# Patient Record
Sex: Female | Born: 1945 | Race: White | Hispanic: No | Marital: Married | Smoking: Never smoker
Health system: Southern US, Community
[De-identification: ages and names within clinical notes are randomized; demographics above are authoritative.]

## PROBLEM LIST (undated history)

## (undated) DIAGNOSIS — T7840XA Allergy, unspecified, initial encounter: Secondary | ICD-10-CM

## (undated) DIAGNOSIS — H269 Unspecified cataract: Secondary | ICD-10-CM

## (undated) DIAGNOSIS — E785 Hyperlipidemia, unspecified: Secondary | ICD-10-CM

## (undated) DIAGNOSIS — M8580 Other specified disorders of bone density and structure, unspecified site: Secondary | ICD-10-CM

## (undated) DIAGNOSIS — Z5189 Encounter for other specified aftercare: Secondary | ICD-10-CM

## (undated) DIAGNOSIS — E039 Hypothyroidism, unspecified: Secondary | ICD-10-CM

## (undated) DIAGNOSIS — F419 Anxiety disorder, unspecified: Secondary | ICD-10-CM

## (undated) HISTORY — DX: Hypothyroidism, unspecified: E03.9

## (undated) HISTORY — PX: COLONOSCOPY: SHX174

## (undated) HISTORY — DX: Other specified disorders of bone density and structure, unspecified site: M85.80

## (undated) HISTORY — DX: Unspecified cataract: H26.9

## (undated) HISTORY — DX: Encounter for other specified aftercare: Z51.89

## (undated) HISTORY — DX: Hyperlipidemia, unspecified: E78.5

## (undated) HISTORY — PX: POLYPECTOMY: SHX149

## (undated) HISTORY — DX: Allergy, unspecified, initial encounter: T78.40XA

## (undated) HISTORY — DX: Anxiety disorder, unspecified: F41.9

---

## 1970-04-01 DIAGNOSIS — Z5189 Encounter for other specified aftercare: Secondary | ICD-10-CM

## 1970-04-01 HISTORY — DX: Encounter for other specified aftercare: Z51.89

## 1978-12-01 DIAGNOSIS — E039 Hypothyroidism, unspecified: Secondary | ICD-10-CM

## 1978-12-01 HISTORY — DX: Hypothyroidism, unspecified: E03.9

## 1980-04-01 HISTORY — PX: THYROIDECTOMY, PARTIAL: SHX18

## 1989-04-01 HISTORY — PX: LITHOTRIPSY: SUR834

## 1991-04-02 HISTORY — PX: LUMBAR DISC SURGERY: SHX700

## 1994-04-01 HISTORY — PX: LAPAROSCOPIC HYSTERECTOMY: SHX1926

## 1994-04-01 HISTORY — PX: BREAST BIOPSY: SHX20

## 1997-04-01 HISTORY — PX: CRANIOPLASTY: SHX1407

## 1998-01-30 ENCOUNTER — Inpatient Hospital Stay (HOSPITAL_COMMUNITY): Admission: RE | Admit: 1998-01-30 | Discharge: 1998-01-31 | Payer: Self-pay | Admitting: Neurosurgery

## 1998-01-30 ENCOUNTER — Encounter: Payer: Self-pay | Admitting: Neurosurgery

## 2004-10-23 ENCOUNTER — Ambulatory Visit: Payer: Self-pay | Admitting: Unknown Physician Specialty

## 2005-11-01 ENCOUNTER — Ambulatory Visit: Payer: Self-pay | Admitting: Gastroenterology

## 2005-11-19 ENCOUNTER — Encounter (INDEPENDENT_AMBULATORY_CARE_PROVIDER_SITE_OTHER): Payer: Self-pay | Admitting: Gastroenterology

## 2005-11-19 ENCOUNTER — Ambulatory Visit: Payer: Self-pay | Admitting: Gastroenterology

## 2007-10-30 ENCOUNTER — Encounter: Payer: Self-pay | Admitting: Gastroenterology

## 2008-04-01 HISTORY — PX: LAPAROSCOPIC SIGMOID COLECTOMY: SHX5928

## 2008-05-13 ENCOUNTER — Encounter: Payer: Self-pay | Admitting: Gastroenterology

## 2008-05-26 ENCOUNTER — Ambulatory Visit: Payer: Self-pay | Admitting: Gastroenterology

## 2008-05-26 DIAGNOSIS — K625 Hemorrhage of anus and rectum: Secondary | ICD-10-CM | POA: Insufficient documentation

## 2008-05-26 DIAGNOSIS — K5732 Diverticulitis of large intestine without perforation or abscess without bleeding: Secondary | ICD-10-CM | POA: Insufficient documentation

## 2008-06-03 ENCOUNTER — Ambulatory Visit: Payer: Self-pay | Admitting: Gastroenterology

## 2008-08-23 ENCOUNTER — Telehealth: Payer: Self-pay | Admitting: Gastroenterology

## 2008-08-24 ENCOUNTER — Ambulatory Visit: Payer: Self-pay | Admitting: Gastroenterology

## 2008-09-08 ENCOUNTER — Encounter: Payer: Self-pay | Admitting: Gastroenterology

## 2008-09-28 ENCOUNTER — Other Ambulatory Visit: Admission: RE | Admit: 2008-09-28 | Discharge: 2008-09-28 | Payer: Self-pay | Admitting: Diagnostic Radiology

## 2008-09-28 ENCOUNTER — Encounter: Admission: RE | Admit: 2008-09-28 | Discharge: 2008-09-28 | Payer: Self-pay | Admitting: Internal Medicine

## 2009-02-02 ENCOUNTER — Encounter: Payer: Self-pay | Admitting: Gastroenterology

## 2009-02-07 ENCOUNTER — Ambulatory Visit: Payer: Self-pay | Admitting: Surgery

## 2009-02-07 ENCOUNTER — Telehealth: Payer: Self-pay | Admitting: Gastroenterology

## 2009-02-14 ENCOUNTER — Inpatient Hospital Stay: Payer: Self-pay | Admitting: Surgery

## 2009-03-01 ENCOUNTER — Encounter: Payer: Self-pay | Admitting: Gastroenterology

## 2009-10-17 ENCOUNTER — Encounter: Admission: RE | Admit: 2009-10-17 | Discharge: 2009-10-17 | Payer: Self-pay | Admitting: Internal Medicine

## 2010-01-17 ENCOUNTER — Encounter: Admission: RE | Admit: 2010-01-17 | Discharge: 2010-01-17 | Payer: Self-pay | Admitting: Internal Medicine

## 2010-06-20 ENCOUNTER — Other Ambulatory Visit: Payer: Self-pay | Admitting: Internal Medicine

## 2010-06-20 DIAGNOSIS — E049 Nontoxic goiter, unspecified: Secondary | ICD-10-CM

## 2010-07-20 ENCOUNTER — Other Ambulatory Visit: Payer: Self-pay

## 2010-07-25 ENCOUNTER — Ambulatory Visit
Admission: RE | Admit: 2010-07-25 | Discharge: 2010-07-25 | Disposition: A | Discharge status: Home / Self Care | Payer: Medicare Other | Source: Ambulatory Visit | Attending: Internal Medicine | Admitting: Internal Medicine

## 2010-07-25 DIAGNOSIS — E049 Nontoxic goiter, unspecified: Secondary | ICD-10-CM

## 2011-10-23 ENCOUNTER — Other Ambulatory Visit: Payer: Self-pay | Admitting: Internal Medicine

## 2011-10-23 DIAGNOSIS — E042 Nontoxic multinodular goiter: Secondary | ICD-10-CM

## 2011-10-24 ENCOUNTER — Ambulatory Visit
Admission: RE | Admit: 2011-10-24 | Discharge: 2011-10-24 | Disposition: A | Discharge status: Home / Self Care | Payer: Medicare Other | Source: Ambulatory Visit | Attending: Internal Medicine | Admitting: Internal Medicine

## 2011-10-24 DIAGNOSIS — E042 Nontoxic multinodular goiter: Secondary | ICD-10-CM

## 2013-04-30 ENCOUNTER — Encounter: Payer: Self-pay | Admitting: Gastroenterology

## 2013-05-04 ENCOUNTER — Other Ambulatory Visit: Payer: Self-pay | Admitting: Dermatology

## 2013-05-06 ENCOUNTER — Encounter: Payer: Self-pay | Admitting: Gastroenterology

## 2013-06-25 ENCOUNTER — Ambulatory Visit (AMBULATORY_SURGERY_CENTER): Payer: Medicare Other | Admitting: *Deleted

## 2013-06-25 VITALS — Ht 64.0 in | Wt 154.0 lb

## 2013-06-25 DIAGNOSIS — Z800 Family history of malignant neoplasm of digestive organs: Secondary | ICD-10-CM

## 2013-06-25 MED ORDER — NA SULFATE-K SULFATE-MG SULF 17.5-3.13-1.6 GM/177ML PO SOLN
1.0000 | Freq: Once | ORAL | Status: DC
Start: 2013-06-25 — End: 2013-07-15

## 2013-06-25 NOTE — Progress Notes (Signed)
No allergies to eggs or soy. No problems with anesthesia.  Pt given Emmi instructions for colonoscopy  

## 2013-07-01 ENCOUNTER — Encounter: Payer: Self-pay | Admitting: Gastroenterology

## 2013-07-08 ENCOUNTER — Ambulatory Visit (AMBULATORY_SURGERY_CENTER): Payer: Medicare Other | Admitting: Gastroenterology

## 2013-07-08 ENCOUNTER — Encounter: Payer: Self-pay | Admitting: Gastroenterology

## 2013-07-08 VITALS — BP 122/60 | HR 65 | Temp 97.3°F | Resp 17 | Ht 64.0 in | Wt 154.0 lb

## 2013-07-08 DIAGNOSIS — D126 Benign neoplasm of colon, unspecified: Secondary | ICD-10-CM

## 2013-07-08 DIAGNOSIS — Z1211 Encounter for screening for malignant neoplasm of colon: Secondary | ICD-10-CM

## 2013-07-08 DIAGNOSIS — Z800 Family history of malignant neoplasm of digestive organs: Secondary | ICD-10-CM

## 2013-07-08 DIAGNOSIS — K5730 Diverticulosis of large intestine without perforation or abscess without bleeding: Secondary | ICD-10-CM

## 2013-07-08 MED ORDER — SODIUM CHLORIDE 0.9 % IV SOLN: 500.0000 mL | INTRAVENOUS | Status: DC

## 2013-07-08 NOTE — Patient Instructions (Signed)
YOU HAD AN ENDOSCOPIC PROCEDURE TODAY AT THE [facility]: Refer to the procedure report that was given to you for any specific questions about what was found during the examination.  If the procedure report does not answer your questions, please call your gastroenterologist to clarify.  If you requested that your care partner not be given the details of your procedure findings, then the procedure report has been included in a sealed envelope for you to review at your convenience later.  YOU SHOULD EXPECT: Some feelings of bloating in the abdomen. Passage of more gas than usual.  Walking can help get rid of the air that was put into your GI tract during the procedure and reduce the bloating. If you had a lower endoscopy (such as a colonoscopy or flexible sigmoidoscopy) you may notice spotting of blood in your stool or on the toilet paper. If you underwent a bowel prep for your procedure, then you may not have a normal bowel movement for a few days.  DIET: Your first meal following the procedure should be a light meal and then it is ok to progress to your normal diet.  A half-sandwich or bowl of soup is an example of a good first meal.  Heavy or fried foods are harder to digest and may make you feel nauseous or bloated.  Likewise meals heavy in dairy and vegetables can cause extra gas to form and this can also increase the bloating.  Drink plenty of fluids but you should avoid alcoholic beverages for 24 hours.  ACTIVITY: Your care partner should take you home directly after the procedure.  You should plan to take it easy, moving slowly for the rest of the day.  You can resume normal activity the day after the procedure however you should NOT DRIVE or use heavy machinery for 24 hours (because of the sedation medicines used during the test).    SYMPTOMS TO REPORT IMMEDIATELY: A gastroenterologist can be reached at any hour.  During normal business hours, 8:30 AM to 5:00 PM Monday through Friday,  call [phone].  After hours and on weekends, please call the GI answering service at [phone] who will take a message and have the physician on call contact you.   Following lower endoscopy (colonoscopy or flexible sigmoidoscopy):  Excessive amounts of blood in the stool  Significant tenderness or worsening of abdominal pains  Swelling of the abdomen that is new, acute  Fever of 100F or higher   FOLLOW UP: If any biopsies were taken you will be contacted by phone or by letter within the next 1-3 weeks.  Call your gastroenterologist if you have not heard about the biopsies in 3 weeks.  Our staff will call the home number listed on your records the next business day following your procedure to check on you and address any questions or concerns that you may have at that time regarding the information given to you following your procedure. This is a courtesy call and so if there is no answer at the home number and we have not heard from you through the emergency physician on call, we will assume that you have returned to your regular daily activities without incident.  SIGNATURES/CONFIDENTIALITY: You and/or your care partner have signed paperwork which will be entered into your electronic medical record.  These signatures attest to the fact that that the information above on your After Visit Summary has been reviewed and is understood.  Full responsibility of the confidentiality of   this discharge information lies with you and/or your care-partner.   Resume medications. Information given on polyps,diverticulosis and high fiber diet with discharge instructions. 

## 2013-07-08 NOTE — Progress Notes (Signed)
A/ox3 pleased with MAC, report to Sheila RN 

## 2013-07-08 NOTE — Op Note (Signed)
[facility]  [address] [place], [id number]   COLONOSCOPY PROCEDURE REPORT  PATIENT: Alexis Hull, Alexis Hull  MR#: [id number] BIRTHDATE: 1945/10/08 , 68  yrs. old GENDER: Female ENDOSCOPIST: Inda Castle, MD REFERRED [id number] Dione Housekeeper, M.D. PROCEDURE DATE:  07/08/2013 PROCEDURE:   Colonoscopy with snare polypectomy First Screening Colonoscopy - Avg.  risk and is 50 yrs.  old or older - No.  Prior Negative Screening - Now for repeat screening. Above average risk  History of Adenoma - Now for follow-up colonoscopy & has been > or = to 3 yrs.  N/A  Polyps Removed Today? Yes. ASA CLASS:   Class II INDICATIONS:Patient's immediate family history of colon cancer. (mother, early 3's, maternal grandmother - age unknown) MEDICATIONS: MAC sedation, administered by CRNA and propofol (Diprivan) 200mg  IV  DESCRIPTION OF PROCEDURE:   After the risks benefits and alternatives of the procedure were thoroughly explained, informed consent was obtained.  A digital rectal exam revealed no abnormalities of the rectum.   The [id number]  endoscope was introduced through the anus and advanced to the cecum, which was identified by both the appendix and ileocecal valve. No adverse events experienced.   The quality of the prep was excellent using Suprep  The instrument was then slowly withdrawn as the colon was fully examined.      COLON FINDINGS: Surgical anastomosis was noted at 25 cm from the anus (status post sigmoid resection).   A sessile polyp measuring 8 mm in size was found in the ascending colon.  A polypectomy was performed with a cold snare.  The resection was complete and the polyp tissue was completely retrieved.   There was mild scattered diverticulosis noted in the sigmoid colon, descending colon, transverse colon, and ascending colon.   The colon was otherwise normal.  There was no diverticulosis, inflammation, polyps or cancers unless previously stated.   Retroflexed views revealed no abnormalities. The time to cecum=3 minutes 36 seconds.  Withdrawal time=8 minutes 59 seconds.  The scope was withdrawn and the procedure completed. COMPLICATIONS: There were no complications.  ENDOSCOPIC IMPRESSION: 1.   Surgical anastomosis was noted at 25 cm from the anus (status post sigmoid resection). 2.   Sessile polyp measuring 8 mm in size was found in the ascending colon; polypectomy was performed with a cold snare 3.   There was mild diverticulosis noted in the sigmoid colon, descending colon, transverse colon, and ascending colon 4.   The colon was otherwise normal  RECOMMENDATIONS: Given your significant family history of colon cancer, you should have a repeat colonoscopy in 5 years   eSigned:  Inda Castle, MD 07/08/2013 10:07 AM   cc:   PATIENT NAME:  Alexis Hull, Alexis Hull MR#: [id number]

## 2013-07-08 NOTE — Progress Notes (Signed)
Called to room to assist during endoscopic procedure.  Patient ID and intended procedure confirmed with present staff. Received instructions for my participation in the procedure from the performing physician.  

## 2013-07-09 ENCOUNTER — Telehealth: Payer: Self-pay | Admitting: *Deleted

## 2013-07-09 NOTE — Telephone Encounter (Signed)
  Follow up Call-  Call back number 07/08/2013  Post procedure Call Back phone  # [phone]  Permission to leave phone message Yes     Patient questions:  Do you have a fever, pain , or abdominal swelling? no Pain Score  0 *  Have you tolerated food without any problems? yes  Have you been able to return to your normal activities? yes  Do you have any questions about your discharge instructions: Diet   no Medications  no Follow up visit  no  Do you have questions or concerns about your Care? no  Actions: * If pain score is 4 or above: No action needed, pain <4.

## 2013-07-13 ENCOUNTER — Encounter: Payer: Self-pay | Admitting: Gastroenterology

## 2013-07-14 ENCOUNTER — Encounter: Payer: Self-pay | Admitting: Nurse Practitioner

## 2013-07-15 ENCOUNTER — Ambulatory Visit: Payer: Self-pay | Admitting: Nurse Practitioner

## 2013-07-15 ENCOUNTER — Encounter: Payer: Self-pay | Admitting: Nurse Practitioner

## 2013-07-15 ENCOUNTER — Ambulatory Visit (INDEPENDENT_AMBULATORY_CARE_PROVIDER_SITE_OTHER): Payer: Medicare Other | Admitting: Nurse Practitioner

## 2013-07-15 VITALS — BP 138/86 | HR 72 | Resp 18 | Ht 64.0 in | Wt 152.0 lb

## 2013-07-15 DIAGNOSIS — E039 Hypothyroidism, unspecified: Secondary | ICD-10-CM

## 2013-07-15 DIAGNOSIS — Z0000 Encounter for general adult medical examination without abnormal findings: Secondary | ICD-10-CM

## 2013-07-15 DIAGNOSIS — M899 Disorder of bone, unspecified: Secondary | ICD-10-CM

## 2013-07-15 DIAGNOSIS — M949 Disorder of cartilage, unspecified: Secondary | ICD-10-CM

## 2013-07-15 DIAGNOSIS — N952 Postmenopausal atrophic vaginitis: Secondary | ICD-10-CM

## 2013-07-15 DIAGNOSIS — M8580 Other specified disorders of bone density and structure, unspecified site: Secondary | ICD-10-CM

## 2013-07-15 DIAGNOSIS — Z01419 Encounter for gynecological examination (general) (routine) without abnormal findings: Secondary | ICD-10-CM

## 2013-07-15 DIAGNOSIS — E042 Nontoxic multinodular goiter: Secondary | ICD-10-CM

## 2013-07-15 MED ORDER — ESTROGENS, CONJUGATED 0.625 MG/GM VA CREA
TOPICAL_CREAM | VAGINAL | Status: DC
Start: 2013-07-15 — End: 2015-03-20

## 2013-07-15 NOTE — Patient Instructions (Signed)

## 2013-07-15 NOTE — Progress Notes (Signed)
68 y.o. G46P0020 Married Caucasian Fe here for annual exam.  This past January  passed a kidney stone.  She feels well now.  She continues using her Premarin vaginal cream which really helps her with vaginal dryness and dyspareunia.  She also has had less urinary tract infections. She finds that her cream runs out before her next RX can be filled and that is frustrating for her as she has to ration her med's.  She leaves next week for a trip to [place] and then cruise on the [place].  Patient's last menstrual period was 08/31/1994.          Sexually active: yes  The current method of family planning is none.    Exercising: yes  [employer]  Smoker:  no  Health Maintenance: Pap:  2010 MMG:  10/2012 - Normal - Per patient Colonoscopy:  07/08/2013 1 polyp results are pending every 5 years  BMD:   11/2012 osteopenia improved TDaP:  09/2011 Shingles: age 33 Labs: PCP   reports that she has never smoked. She has never used smokeless tobacco. She reports that she drinks about 7.2 ounces of alcohol per week. She reports that she does not use illicit drugs.  Past Medical History  Diagnosis Date  . Blood transfusion without reported diagnosis 1972    after miscarriage  . Hypothyroidism 1980's    Past Surgical History  Procedure Laterality Date  . Thyroidectomy, partial Left 1982  . Lumbar disc surgery  1993    discectomy  . Breast biopsy Right 1996    benign  . Laparoscopic hysterectomy  1996  . Cranioplasty  1999    Lytic skull lesion  . Laparoscopic sigmoid colectomy  2010    recurrent divertic  . Lithotripsy  1991    with sling and stent    Current Outpatient Prescriptions  Medication Sig Dispense Refill  . ALPRAZolam (XANAX) 0.25 MG tablet Take 0.25 mg by mouth at bedtime as needed for anxiety.      Marland Kitchen aspirin 81 MG tablet Take 81 mg by mouth once a week.       . Calcium Carb-Cholecalciferol (CALCIUM 600 + D PO) Take by mouth daily.      . Cholecalciferol (D3 ADULT PO)  Take by mouth daily.      Marland Kitchen CRANBERRY PO Take by mouth. Takes 4200 mg daily      . [facility]S MAGNESIUM PO Take by mouth daily.      . Estrogens, Conjugated (PREMARIN VA) Place vaginally 2 (two) times a week.      . Glucosamine-Chondroit-Vit C-Mn (GLUCOSAMINE CHONDR 1500 COMPLX PO) Take by mouth daily.      Marland Kitchen KRILL OIL PO Take 300 mg by mouth daily.      Marland Kitchen levothyroxine (SYNTHROID, LEVOTHROID) 50 MCG tablet Take 50 mcg by mouth daily before breakfast.      . loratadine (CLARITIN) 10 MG tablet Take 10 mg by mouth daily.      . Methylcellulose, Laxative, (CITRUCEL) 500 MG TABS Take by mouth daily.      . Multiple Vitamin (MULTIVITAMIN) tablet Take 1 tablet by mouth daily.      Marland Kitchen tretinoin (RETIN-A) 0.025 % cream Apply topically at bedtime.       . conjugated estrogens (PREMARIN) vaginal cream Use 1 g vaginally three times a week  30 g  12   No current facility-administered medications for this visit.    Family History  Problem Relation Age of Onset  . Colon cancer  Mother 71  . Stroke Mother   . Aneurysm Mother     brain  . Breast cancer Mother   . Colon cancer Maternal Grandmother     not sure age of onset  . Diabetes Father   . Heart attack Father   . Hypertension Brother     ROS:  Pertinent items are noted in HPI.  Otherwise, a comprehensive ROS was negative.  Exam:   BP 138/86  Pulse 72  Resp 18  Ht 5\' 4"  (1.626 m)  Wt 152 lb (68.947 kg)  BMI 26.08 kg/m2  LMP 08/31/1994 Height: 5\' 4"  (162.6 cm)  Ht Readings from Last 3 Encounters:  07/15/13 5\' 4"  (1.626 m)  07/08/13 5\' 4"  (1.626 m)  06/25/13 5\' 4"  (1.626 m)    General appearance: alert, cooperative and appears stated age Head: Normocephalic, without obvious abnormality, atraumatic Neck: no adenopathy, supple, symmetrical, trachea midline and thyroid nodule on the right tto inspection and palpation Lungs: clear to auscultation bilaterally Breasts: normal appearance, no masses or tenderness Heart: regular rate and  rhythm Abdomen: soft, non-tender; no masses,  no organomegaly Extremities: extremities normal, atraumatic, no cyanosis or edema Skin: Skin color, texture, turgor normal. No rashes or lesions Lymph nodes: Cervical, supraclavicular, and axillary nodes normal. No abnormal inguinal nodes palpated Neurologic: Grossly normal   Pelvic: External genitalia:  no lesions              Urethra:  normal appearing urethra with no masses, tenderness or lesions              Bartholin's and Skene's: normal                 Vagina: atrophic appearing vagina with pale color and discharge, no lesions              Cervix: absent              Pap taken: yes per request Bimanual Exam:  Uterus:  uterus absent              Adnexa: no mass, fullness, tenderness               Rectovaginal: Confirms               Anus:  normal sphincter tone, no lesions  A:  Well Woman with normal exam  Postmenopausal  Atrophic vaginitis some improved on vaginal estrogen  Osteopenia - stable  Hypothyroid and history of thyroid nodules that are stable  P:   Pap smear as per guidelines   Mammogram due 10/2013  Refill on Premarin Vaginal cream for a year  Counseled with  Potential risk of estrogen with DVT, CVA, cancer.  She is aware on the trip to keep extremities moving while flying and do ankle pumps.  Counseled on breast self exam, mammography screening, use and side effects of HRT, adequate intake of calcium and vitamin D, diet and exercise, Kegel's exercises return annually or prn ROI for Mammo results at [facility] An After Visit Summary was printed and given to the patient.

## 2013-07-19 NOTE — Progress Notes (Signed)
Encounter reviewed by Dr. Brook Silva.  

## 2013-07-20 LAB — IPS PAP SMEAR ONLY

## 2013-09-01 ENCOUNTER — Other Ambulatory Visit: Payer: Self-pay | Admitting: Dermatology

## 2014-01-31 ENCOUNTER — Encounter: Payer: Self-pay | Admitting: Nurse Practitioner

## 2014-04-01 DIAGNOSIS — E785 Hyperlipidemia, unspecified: Secondary | ICD-10-CM

## 2014-04-01 HISTORY — DX: Hyperlipidemia, unspecified: E78.5

## 2014-10-06 ENCOUNTER — Other Ambulatory Visit: Payer: Self-pay | Admitting: Internal Medicine

## 2014-10-06 DIAGNOSIS — E042 Nontoxic multinodular goiter: Secondary | ICD-10-CM

## 2014-10-07 ENCOUNTER — Ambulatory Visit
Admission: RE | Admit: 2014-10-07 | Discharge: 2014-10-07 | Disposition: A | Discharge status: Home / Self Care | Payer: Medicare Other | Source: Ambulatory Visit | Attending: Internal Medicine | Admitting: Internal Medicine

## 2014-10-07 DIAGNOSIS — E042 Nontoxic multinodular goiter: Secondary | ICD-10-CM

## 2014-11-01 ENCOUNTER — Encounter: Payer: Self-pay | Admitting: Gastroenterology

## 2015-03-20 ENCOUNTER — Ambulatory Visit (INDEPENDENT_AMBULATORY_CARE_PROVIDER_SITE_OTHER): Payer: Medicare Other | Admitting: Nurse Practitioner

## 2015-03-20 ENCOUNTER — Encounter: Payer: Self-pay | Admitting: Nurse Practitioner

## 2015-03-20 VITALS — BP 144/90 | HR 76 | Ht 64.0 in | Wt 149.0 lb

## 2015-03-20 DIAGNOSIS — Z01419 Encounter for gynecological examination (general) (routine) without abnormal findings: Secondary | ICD-10-CM

## 2015-03-20 DIAGNOSIS — N952 Postmenopausal atrophic vaginitis: Secondary | ICD-10-CM | POA: Diagnosis not present

## 2015-03-20 DIAGNOSIS — M8580 Other specified disorders of bone density and structure, unspecified site: Secondary | ICD-10-CM | POA: Diagnosis not present

## 2015-03-20 MED ORDER — ESTROGENS, CONJUGATED 0.625 MG/GM VA CREA: TOPICAL_CREAM | VAGINAL | Status: DC

## 2015-03-20 NOTE — Patient Instructions (Signed)

## 2015-03-20 NOTE — Progress Notes (Signed)
Patient ID: Alexis Hull, female   DOB: 05/09/1945, 69 y.o.   MRN: [id number]  69 y.o. G2P0020 Married  Caucasian Fe here for annual exam.  No new health problems.  Patient's last menstrual period was 08/31/1994 (approximate).          Sexually active: Yes.    The current method of family planning is status post hysterectomy.    Exercising: No.  The patient does not participate in regular exercise at present. Smoker:  no  Health Maintenance: Pap: 07/15/13, Negative MMG: 09/2014, normal per patient, [facility] Colonoscopy: 07/08/13, polyp-adenoma, repeat in 5 years BMD: 12/15/12, Dr. Brigitte Pulse TDaP:  10/19/10 Zostavax: 04/01/05 Pneumovax: 09/2011, Prevnar 10/06/14 Hep C and HIV:  Will discuss with Dr. Brigitte Pulse Labs: PCP, Dr Brigitte Pulse takes care of all labs   reports that she has never smoked. She has never used smokeless tobacco. She reports that she drinks about 7.2 oz of alcohol per week. She reports that she does not use illicit drugs.  Past Medical History  Diagnosis Date  . Blood transfusion without reported diagnosis 1972    after miscarriage  . Hypothyroidism 1980's  . Hyperlipidemia 2016    atorvastatin    Past Surgical History  Procedure Laterality Date  . Thyroidectomy, partial Left 1982  . Lumbar disc surgery  1993    discectomy  . Breast biopsy Right 1996    benign  . Laparoscopic hysterectomy  1996  . Cranioplasty  1999    Lytic skull lesion  . Laparoscopic sigmoid colectomy  2010    recurrent divertic  . Lithotripsy  1991    with sling and stent    Current Outpatient Prescriptions  Medication Sig Dispense Refill  . ALPRAZolam (XANAX) 0.25 MG tablet Take 0.25 mg by mouth at bedtime as needed for anxiety.    Marland Kitchen aspirin 81 MG tablet Take 81 mg by mouth once a week.     Marland Kitchen atorvastatin (LIPITOR) 10 MG tablet Take 10 mg by mouth at bedtime.  6  . Biotin 5000 MCG TABS Take 1 tablet by mouth daily.    . Calcium Carb-Cholecalciferol (CALCIUM 600 + D PO) Take by mouth daily.    Marland Kitchen  conjugated estrogens (PREMARIN) vaginal cream Use 1 g vaginally three times a week 30 g 12  . CRANBERRY PO Take by mouth. Takes 4200 mg daily    . levothyroxine (SYNTHROID, LEVOTHROID) 50 MCG tablet Take 50 mcg by mouth every evening.     . loratadine (CLARITIN) 10 MG tablet Take 10 mg by mouth daily.    . Methylcellulose, Laxative, (CITRUCEL) 500 MG TABS Take by mouth daily. Citrucel    . Multiple Vitamins-Minerals (HAIR/SKIN/NAILS PO) Take 1 tablet by mouth daily.    . NON FORMULARY ALGAE 900 DHA 300 MG, 1 capsule daily    . tretinoin (RETIN-A) 0.025 % cream Apply topically at bedtime.      No current facility-administered medications for this visit.    Family History  Problem Relation Age of Onset  . Colon cancer Mother 42  . Stroke Mother   . Aneurysm Mother     brain  . Breast cancer Mother   . Colon cancer Maternal Grandmother     not sure age of onset  . Diabetes Father   . Heart attack Father   . Hypertension Brother     ROS:  Pertinent items are noted in HPI.  Otherwise, a comprehensive ROS was negative.  Exam:   BP 144/90 mmHg  Pulse  76  Ht 5\' 4"  (1.626 m)  Wt 149 lb (67.586 kg)  BMI 25.56 kg/m2  LMP 08/31/1994 (Approximate) Height: 5\' 4"  (162.6 cm) Ht Readings from Last 3 Encounters:  03/20/15 5\' 4"  (1.626 m)  07/15/13 5\' 4"  (1.626 m)  07/08/13 5\' 4"  (1.626 m)    General appearance: alert, cooperative and appears stated age Head: Normocephalic, without obvious abnormality, atraumatic Neck: no adenopathy, supple, symmetrical, trachea midline and thyroid normal to inspection and palpation Lungs: clear to auscultation bilaterally Breasts: normal appearance, no masses or tenderness Heart: regular rate and rhythm Abdomen: soft, non-tender; no masses,  no organomegaly Extremities: extremities normal, atraumatic, no cyanosis or edema Skin: Skin color, texture, turgor normal. No rashes or lesions Lymph nodes: Cervical, supraclavicular, and axillary nodes  normal. No abnormal inguinal nodes palpated Neurologic: Grossly normal   Pelvic: External genitalia:  no lesions              Urethra:  normal appearing urethra with no masses, tenderness or lesions              Bartholin's and Skene's: normal                 Vagina: normal appearing vagina with normal color and discharge, no lesions              Cervix: anteverted              Pap taken: No. Bimanual Exam:  Uterus:  normal size, contour, position, consistency, mobility, non-tender              Adnexa: no mass, fullness, tenderness               Rectovaginal: Confirms               Anus:  normal sphincter tone, no lesions  Chaperone present: no   A:  Well Woman with normal exam  Postmenopausal Atrophic vaginitis some improved on vaginal estrogen Osteopenia - stable Hypothyroid and history of thyroid nodules that are stable   P:   Reviewed health and wellness pertinent to exam  Pap smear as above  Mammogram is due 09/2015  Counseled on breast self exam, mammography screening, adequate intake of calcium and vitamin D, diet and exercise return annually or prn  An After Visit Summary was printed and given to the patient.

## 2015-03-21 NOTE — Progress Notes (Signed)
Encounter reviewed Trinisha Paget, MD   

## 2017-07-14 ENCOUNTER — Telehealth: Payer: Self-pay | Admitting: Obstetrics and Gynecology

## 2017-07-14 NOTE — Telephone Encounter (Signed)
Patient is having an issue with vaginal dryness. Last seen 03/20/15.

## 2017-07-14 NOTE — Telephone Encounter (Signed)
Call placed to [facility], spoke with Tyranny, requested copy of MMG dated 11/19/16 be faxed to office at [phone].

## 2017-07-14 NOTE — Telephone Encounter (Signed)
Copy of MMG report received, placed on Melvia Heaps, CNM desk for review.   Routing to provider for final review. Patient is agreeable to disposition. Will close encounter.

## 2017-07-14 NOTE — Telephone Encounter (Signed)
Spoke with patient. Patient reports vaginal dryness, has used premarin vaginal cream in the past, worked well. Patient requesting OV to discuss tx options. Last AEX 03/20/15 with Kem Boroughs, NP.   Denies any other GYN symptoms or bleeding.  Declines to schedule AEX, states prefers to continue AEX with PCP. Last MMG 11/19/2016 at [facility]. Advised patient will call [facility] to request copy of recent MMG. OV scheduled for 07/15/17 at 1:45pm with Melvia Heaps, CNM. Patient verbalizes understanding.

## 2017-07-15 ENCOUNTER — Encounter: Payer: Self-pay | Admitting: Certified Nurse Midwife

## 2017-07-15 ENCOUNTER — Ambulatory Visit: Payer: Medicare Other | Admitting: Certified Nurse Midwife

## 2017-07-15 ENCOUNTER — Other Ambulatory Visit: Payer: Self-pay

## 2017-07-15 VITALS — BP 120/80 | HR 64 | Resp 16 | Ht 64.0 in | Wt 146.0 lb

## 2017-07-15 DIAGNOSIS — Z0000 Encounter for general adult medical examination without abnormal findings: Secondary | ICD-10-CM

## 2017-07-15 DIAGNOSIS — Z01419 Encounter for gynecological examination (general) (routine) without abnormal findings: Secondary | ICD-10-CM

## 2017-07-15 DIAGNOSIS — N952 Postmenopausal atrophic vaginitis: Secondary | ICD-10-CM

## 2017-07-15 MED ORDER — ESTROGENS, CONJUGATED 0.625 MG/GM VA CREA: TOPICAL_CREAM | VAGINAL | 6 refills | Status: AC

## 2017-07-15 NOTE — Progress Notes (Signed)
72 y.o. Married Caucasian female G2P0020 here with complaint of vaginal dryness with discomfort with sexual activity if she does not use Premarin cream. No symptoms of itching, burning, and increase discharge or vaginal infection.Marland Kitchen Describes discharge as normal. Denies vaginal bleeding. History of TAH with BSO for ovarian cysts. Currently using Premarin cream twice weekly, occasionally 3 times weekly. Patient sees PCP for aex and declines gyn aex today. Would like to continue her Premarin cream unless something that would work better. She has been happy with use with no problems. Some dryness at other times occasionally. PCP does not do breast and pelvic exam, but has if asked. She would prefer to do here. No other health issues today.   ROS pertinent to HPI  O:Healthy female WDWN Affect: normal, orientation x 3  Exam: Heart: NSRR, Lungs clear Thyroid not  Enlarged Breasts bilateral: no masses, tenderness, nipple discharge or skin change noted, no axillary enlargement or enlarged or tender lymph nodes Abdomen: soft, non tender, no masses  Inguinal Lymph nodes: no enlargement or tenderness Pelvic exam: External genital: normal female, atrophic appearance BUS: negative Vagina: scant normal appearing discharge, atrophic appearance   Cervix: absent Uterus:absent Adnexa:surgically absent, no masses or fullness noted   A:Normal breast exam Normal pelvic exam Atrophic vaginitis   P:Discussed findings of normal exam. Discussed atrophic vaginitis and etiology. Discussed continued use of Premarin cream for comfort and tissue support. Risks/benefits/warning signs given and need to advise if occurs. Discussed need for exam to be able to continue to give Premarin cream unless PCP is doing exam and will order for her. Plans to come back here to continue. Discussed coconut oil use for vaginal dryness. Instructions given for use with sexual activity. Questions addressed. Rx Premarin cream see order with  instructions  Rv prn, annual

## 2017-08-04 ENCOUNTER — Encounter: Payer: Self-pay | Admitting: Certified Nurse Midwife

## 2018-09-23 ENCOUNTER — Encounter: Payer: Self-pay | Admitting: Gastroenterology

## 2018-09-29 ENCOUNTER — Ambulatory Visit: Payer: Medicare Other | Admitting: *Deleted

## 2018-09-29 ENCOUNTER — Other Ambulatory Visit: Payer: Self-pay

## 2018-09-29 VITALS — Ht 64.0 in | Wt 151.0 lb

## 2018-09-29 DIAGNOSIS — Z86010 Personal history of colonic polyps: Secondary | ICD-10-CM

## 2018-09-29 DIAGNOSIS — Z800 Family history of malignant neoplasm of digestive organs: Secondary | ICD-10-CM

## 2018-09-29 MED ORDER — SUPREP BOWEL PREP KIT 17.5-3.13-1.6 GM/177ML PO SOLN: 1.0000 | Freq: Once | ORAL | 0 refills | Status: AC

## 2018-09-29 NOTE — Progress Notes (Signed)
No egg or soy allergy known to patient  No issues with past sedation with any surgeries  or procedures, no intubation problems  No diet pills per patient No home 02 use per patient  No blood thinners per patient  Pt denies issues with constipation  No A fib or A flutter  EMMI video sent to pt's e mail   Pt verified name, DOB, address and insurance during PV today. Pt mailed instruction packet to included paper to complete and mail back to [facility] with addressed and stamped envelope, Emmi video, copy of consent form to read and not return, and instructions.PV completed over the phone. Pt encouraged to call with questions or issues -[place] in [place] and she asked me to e mail her the instruction packet for her prep- E mail sent and also printed and mailed in her packet   Pt is aware that care partner will wait in the car during parking lot; if they feel like they will be too hot to wait in the car; they may wait in the lobby.  We want them to wear a mask (we do not have any that we can provide them), practice social distancing, and we will check their temperatures when they get here.  I did remind patient that their care partner needs to stay in the parking lot the entire time. Pt will wear mask into building

## 2018-10-01 ENCOUNTER — Telehealth: Payer: Self-pay | Admitting: Gastroenterology

## 2018-10-01 NOTE — Telephone Encounter (Signed)
PV was just done 09-29-2018- pt informed it maybe the weekend before she recieves  I did e mail the instructions 09-29-18 but pt states she did not receive them yet- I re e mailed the instructions to her e mail and her husbands e mail  - pt will call me back and let me know if she has received them- she is att her home in [place] [facility] [facility] and her procedure is next week

## 2018-10-06 ENCOUNTER — Telehealth: Payer: Self-pay | Admitting: Gastroenterology

## 2018-10-06 NOTE — Telephone Encounter (Signed)

## 2018-10-07 ENCOUNTER — Ambulatory Visit (AMBULATORY_SURGERY_CENTER): Payer: Medicare Other | Admitting: Gastroenterology

## 2018-10-07 ENCOUNTER — Encounter: Payer: Self-pay | Admitting: Gastroenterology

## 2018-10-07 ENCOUNTER — Other Ambulatory Visit: Payer: Self-pay

## 2018-10-07 VITALS — BP 122/64 | HR 56 | Temp 98.4°F | Resp 16 | Ht 64.0 in | Wt 151.0 lb

## 2018-10-07 DIAGNOSIS — D120 Benign neoplasm of cecum: Secondary | ICD-10-CM

## 2018-10-07 DIAGNOSIS — Z85038 Personal history of other malignant neoplasm of large intestine: Secondary | ICD-10-CM | POA: Diagnosis not present

## 2018-10-07 DIAGNOSIS — K635 Polyp of colon: Secondary | ICD-10-CM

## 2018-10-07 DIAGNOSIS — Z86010 Personal history of colonic polyps: Secondary | ICD-10-CM

## 2018-10-07 DIAGNOSIS — Z1211 Encounter for screening for malignant neoplasm of colon: Secondary | ICD-10-CM | POA: Diagnosis not present

## 2018-10-07 DIAGNOSIS — D123 Benign neoplasm of transverse colon: Secondary | ICD-10-CM

## 2018-10-07 MED ORDER — SODIUM CHLORIDE 0.9 % IV SOLN: 500.0000 mL | Freq: Once | INTRAVENOUS | Status: DC

## 2018-10-07 NOTE — Patient Instructions (Signed)
YOU HAD AN ENDOSCOPIC PROCEDURE TODAY AT [facility]:   Refer to the procedure report that was given to you for any specific questions about what was found during the examination.  If the procedure report does not answer your questions, please call your gastroenterologist to clarify.  If you requested that your care partner not be given the details of your procedure findings, then the procedure report has been included in a sealed envelope for you to review at your convenience later.  YOU SHOULD EXPECT: Some feelings of bloating in the abdomen. Passage of more gas than usual.  Walking can help get rid of the air that was put into your GI tract during the procedure and reduce the bloating. If you had a lower endoscopy (such as a colonoscopy or flexible sigmoidoscopy) you may notice spotting of blood in your stool or on the toilet paper. If you underwent a bowel prep for your procedure, you may not have a normal bowel movement for a few days.  Please Note:  You might notice some irritation and congestion in your nose or some drainage.  This is from the oxygen used during your procedure.  There is no need for concern and it should clear up in a day or so.  SYMPTOMS TO REPORT IMMEDIATELY:   Following lower endoscopy (colonoscopy or flexible sigmoidoscopy):  Excessive amounts of blood in the stool  Significant tenderness or worsening of abdominal pains  Swelling of the abdomen that is new, acute  Fever of 100F or higher  For urgent or emergent issues, a gastroenterologist can be reached at any hour by calling [phone].  DIET:  We do recommend a small meal at first, but then you may proceed to your regular diet.  Drink plenty of fluids but you should avoid alcoholic beverages for 24 hours.  ACTIVITY:  You should plan to take it easy for the rest of today and you should NOT DRIVE or use heavy machinery until tomorrow (because of the sedation medicines used during the test).     FOLLOW UP: Our staff will call the number listed on your records 48-72 hours following your procedure to check on you and address any questions or concerns that you may have regarding the information given to you following your procedure. If we do not reach you, we will leave a message.  We will attempt to reach you two times.  During this call, we will ask if you have developed any symptoms of COVID 19. If you develop any symptoms (ie: fever, flu-like symptoms, shortness of breath, cough etc.) before then, please call [phone].  If you test positive for Covid 19 in the 2 weeks post procedure, please call and report this information to [place].    If any biopsies were taken you will be contacted by phone or by letter within the next 1-3 weeks.  Please call us at [phone] if you have not heard about the biopsies in 3 weeks.    SIGNATURES/CONFIDENTIALITY: You and/or your care partner have signed paperwork which will be entered into your electronic medical record.  These signatures attest to the fact that that the information above on your After Visit Summary has been reviewed and is understood.  Full responsibility of the confidentiality of this discharge information lies with you and/or your care-partner.  Await pathology- next colonoscopy in 3 years  Please read over handouts about polyps and diverticulosis   Please continue your normal medications

## 2018-10-07 NOTE — Progress Notes (Signed)
[place]

## 2018-10-07 NOTE — Progress Notes (Signed)
A/ox3, pleased with MAC, report to RN 

## 2018-10-07 NOTE — Op Note (Signed)
[place] Patient Name: Alexis Hull Procedure Date: 10/07/2018 8:16 AM MRN: [id number] Endoscopist: Mauri Pole , MD Age: 73 Referring MD:  Date of Birth: [birth year] Gender: Female Account #: [id number] Procedure:                Colonoscopy Indications:              Screening patient at increased risk: Family history                            of 1st-degree relative with colorectal cancer at                            age 85 years (or older) Medicines:                Monitored Anesthesia Care Procedure:                Pre-Anesthesia Assessment:                           - Prior to the procedure, a History and Physical                            was performed, and patient medications and                            allergies were reviewed. The patient's tolerance of                            previous anesthesia was also reviewed. The risks                            and benefits of the procedure and the sedation                            options and risks were discussed with the patient.                            All questions were answered, and informed consent                            was obtained. Prior Anticoagulants: The patient has                            taken no previous anticoagulant or antiplatelet                            agents. ASA Grade Assessment: II - A patient with                            mild systemic disease. After reviewing the risks                            and benefits, the patient was deemed in  satisfactory condition to undergo the procedure.                           After obtaining informed consent, the colonoscope                            was passed under direct vision. Throughout the                            procedure, the patient's blood pressure, pulse, and                            oxygen saturations were monitored continuously. The                            Model PCF-[id number])  scope was introduced                            through the anus and advanced to the the cecum,                            identified by appendiceal orifice and ileocecal                            valve. The colonoscopy was performed without                            difficulty. The patient tolerated the procedure                            well. The quality of the bowel preparation was                            excellent. The ileocecal valve, appendiceal                            orifice, and rectum were photographed. Scope In: 8:25:03 AM Scope Out: 8:43:21 AM Scope Withdrawal Time: 0 hours 13 minutes 46 seconds  Total Procedure Duration: 0 hours 18 minutes 18 seconds  Findings:                 The perianal and digital rectal examinations were                            normal.                           Two sessile polyps were found in the transverse                            colon and cecum. The polyps were 10 to 14 mm in                            size. These polyps were removed with a piecemeal  technique using a cold snare. Resection and                            retrieval were complete.                           Scattered small and large-mouthed diverticula were                            found in the sigmoid colon, descending colon,                            transverse colon and ascending colon.                           There was evidence of a prior end-to-side                            colo-colonic anastomosis in the sigmoid colon. This                            was patent and was characterized by healthy                            appearing mucosa. Complications:            No immediate complications. Estimated Blood Loss:     Estimated blood loss was minimal. Impression:               - Two 10 to 14 mm polyps in the transverse colon                            and in the cecum, removed piecemeal using a cold                            snare.  Resected and retrieved.                           - Moderate diverticulosis in the sigmoid colon, in                            the descending colon, in the transverse colon and                            in the ascending colon.                           - Patent end-to-side colo-colonic anastomosis,                            characterized by healthy appearing mucosa. Recommendation:           - Patient has a contact number available for                            emergencies. The signs and symptoms  of potential                            delayed complications were discussed with the                            patient. Return to normal activities tomorrow.                            Written discharge instructions were provided to the                            patient.                           - Resume previous diet.                           - Continue present medications.                           - Await pathology results.                           - Repeat colonoscopy in 3 years for surveillance. Mauri Pole, MD 10/07/2018 8:51:38 AM This report has been signed electronically.

## 2018-10-07 NOTE — Progress Notes (Signed)
Pt's states no medical or surgical changes since previsit or office visit. 

## 2018-10-07 NOTE — Progress Notes (Signed)
Called to room to assist during endoscopic procedure.  Patient ID and intended procedure confirmed with present staff. Received instructions for my participation in the procedure from the performing physician.  

## 2018-10-09 ENCOUNTER — Telehealth: Payer: Self-pay

## 2018-10-09 NOTE — Telephone Encounter (Signed)
  Follow up Call-  Call back number 10/07/2018  Post procedure Call Back phone  # [phone]  Permission to leave phone message Yes  Some recent data might be hidden     Patient questions:  Do you have a fever, pain , or abdominal swelling? No. Pain Score  0 *  Have you tolerated food without any problems? Yes.    Have you been able to return to your normal activities? Yes.    Do you have any questions about your discharge instructions: Diet   No. Medications  No. Follow up visit  No.  Do you have questions or concerns about your Care? No.  Actions: * If pain score is 4 or above: No action needed, pain <4.  1. Have you developed a fever since your procedure? NO  2.   Have you had an respiratory symptoms (SOB or cough) since your procedure? NO  3.   Have you tested positive for COVID 19 since your procedure NO  4.   Have you had any family members/close contacts diagnosed with the COVID 19 since your procedure?  NO   If yes to any of these questions please route to Joylene John, RN and Alphonsa Gin, RN.

## 2018-10-20 ENCOUNTER — Encounter: Payer: Self-pay | Admitting: Gastroenterology

## 2019-04-23 ENCOUNTER — Ambulatory Visit: Payer: Medicare PPO | Attending: Internal Medicine

## 2019-04-23 DIAGNOSIS — Z23 Encounter for immunization: Secondary | ICD-10-CM | POA: Insufficient documentation

## 2019-04-23 NOTE — Progress Notes (Signed)
   Covid-19 Vaccination Clinic  Name:  Alexis Hull    MRN: [id number] DOB: September 01, 1945  04/23/2019  [name] was observed post Covid-19 immunization for 15 minutes without incidence. She was provided with Vaccine Information Sheet and instruction to access the V-Safe system.   [name] was instructed to call 911 with any severe reactions post vaccine: Marland Kitchen Difficulty breathing  . Swelling of your face and throat  . A fast heartbeat  . A bad rash all over your body  . Dizziness and weakness    Immunizations Administered    Name Date Dose VIS Date Route   Pfizer COVID-19 Vaccine 04/23/2019  4:34 PM 0.3 mL 03/12/2019 Intramuscular   Manufacturer: [place]   Lot: [id number]   [place]: [id number]

## 2019-05-14 ENCOUNTER — Ambulatory Visit: Payer: Medicare PPO | Attending: Internal Medicine

## 2019-05-14 DIAGNOSIS — Z23 Encounter for immunization: Secondary | ICD-10-CM

## 2019-05-14 NOTE — Progress Notes (Signed)
   Covid-19 Vaccination Clinic  Name:  Alexis Hull    MRN: [id number] DOB: 1945-06-16  05/14/2019  [name] was observed post Covid-19 immunization for 15 minutes without incidence. She was provided with Vaccine Information Sheet and instruction to access the V-Safe system.   [name] was instructed to call 911 with any severe reactions post vaccine: Marland Kitchen Difficulty breathing  . Swelling of your face and throat  . A fast heartbeat  . A bad rash all over your body  . Dizziness and weakness    Immunizations Administered    Name Date Dose VIS Date Route   Pfizer COVID-19 Vaccine 05/14/2019  8:27 AM 0.3 mL 03/12/2019 Intramuscular   Manufacturer: [place]   Lot: [id number]   [place]: [id number]

## 2019-06-22 ENCOUNTER — Encounter: Payer: Self-pay | Admitting: Certified Nurse Midwife

## 2019-09-27 ENCOUNTER — Encounter: Payer: Self-pay | Admitting: Orthopaedic Surgery

## 2019-09-27 ENCOUNTER — Ambulatory Visit: Payer: Medicare PPO | Admitting: Orthopaedic Surgery

## 2019-09-27 ENCOUNTER — Other Ambulatory Visit: Payer: Self-pay

## 2019-09-27 ENCOUNTER — Ambulatory Visit (INDEPENDENT_AMBULATORY_CARE_PROVIDER_SITE_OTHER): Payer: Medicare PPO

## 2019-09-27 DIAGNOSIS — M5441 Lumbago with sciatica, right side: Secondary | ICD-10-CM | POA: Diagnosis not present

## 2019-09-27 MED ORDER — GABAPENTIN 300 MG PO CAPS: 300.0000 mg | ORAL_CAPSULE | Freq: Three times a day (TID) | ORAL | 1 refills | Status: DC

## 2019-09-27 NOTE — Progress Notes (Signed)
Office Visit Note   Patient: Alexis Hull           Date of Birth: 08/29/1945           MRN: [id number] Visit Date: 09/27/2019              Requested by: Marton Redwood, MD [address] [place],  [place] [address] PCP: Marton Redwood, MD   Assessment & Plan: Visit Diagnoses:  1. Acute right-sided low back pain with right-sided sciatica     Plan: Given the nature of her sciatica with a positive straight leg raise and given the fact that she has had previous spine surgery and is developing worsening right-sided radicular symptoms, a MRI of her lumbar spine is warranted to assess for scar tissue or other reasons that can be compressing or irritating the right L4 or L5 nerve roots.  Also given the fact that she has had previous spine surgery this correlates now with obtaining this MRI.  She agrees with this treatment plan.  Follow-Up Instructions: No follow-ups on file.  The patient will call for follow-up when she has an MRI of her lumbar spine.  Orders:  Orders Placed This Encounter  Procedures  . XR Lumbar Spine 2-3 Views   Meds ordered this encounter  Medications  . gabapentin (NEURONTIN) 300 MG capsule    Sig: Take 1 capsule (300 mg total) by mouth 3 (three) times daily.    Dispense:  90 capsule    Refill:  1      Procedures: No procedures performed   Clinical Data: No additional findings.   Subjective: Chief Complaint  Patient presents with  . Lower Back - Pain  The patient is someone of seen before for right hip trochanteric bursitis.  However she comes in today with severe sciatica on the right side.  It got bad enough that she went to an orthopedic urgent care clinic on June 16 and they put her on a taper of Neurontin.  This started off with 300 mg at night for 3 days and then that she went up to twice a day for 3 days and now is on 3 times a day.  She said the Neurontin is helped quite a bit but she still having right-sided sciatica that radiates down her  thigh posteriorly.  Is been getting worse after yard work.  There are some numbness and tingling and pain in her left leg and numbness tingling in her left foot.  She does have remote history of spine surgery done years ago.  There is no hardware in her spine but they did do spine surgery between L4 and L5.  I believe this was neurosurgery and that surgeon has since retired.  She denies any groin pain.  She is not a diabetic and not a smoker.  She does request a refill of the Neurontin.  HPI  Review of Systems She currently denies any headache, chest pain, shortness of breath, fever, chills, nausea, vomiting  Objective: Vital Signs: LMP 08/31/1994 (Approximate)   Physical Exam She is alert and orient x3 and in no acute distress Ortho Exam Examination of her lower extremities shows fluid range of motion of both hips.  There is minimal pain over the trochanteric area on the right side.  She does have a positive straight leg raise to the right side the 5 out of 5 strength in her bilateral lower extremities.  There is some subjective numbness in her toes on the left side. Specialty  Comments:  No specialty comments available.  Imaging: No results found.   PMFS History: Patient Active Problem List   Diagnosis Date Noted  . 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 Osteopenia 07/15/2013  . Multiple thyroid nodules 07/15/2013  . Unspecified hypothyroidism 07/15/2013  . DIVERTICULITIS OF COLON 05/26/2008  . HEMORRHAGE OF RECTUM AND ANUS 05/26/2008   Past Medical History:  Diagnosis Date  . Allergy   . Anxiety    rare   . Blood transfusion without reported diagnosis 1972   after miscarriage  . Cataract    both eyes   . Hyperlipidemia 2016   atorvastatin  . Hypothyroidism 1980's  . Osteopenia     Family History  Problem Relation Age of Onset  . Colon cancer Mother 62  . Stroke Mother   . Aneurysm Mother        brain  . Breast cancer Mother   . Colon cancer Maternal  Grandmother        not sure age of onset  . Diabetes Father   . Heart attack Father   . Hypertension Brother   . Colon polyps Neg Hx   . Esophageal cancer Neg Hx   . Rectal cancer Neg Hx   . Stomach cancer Neg Hx     Past Surgical History:  Procedure Laterality Date  . BREAST BIOPSY Right 1996   benign  . COLON SURGERY  2010  . COLONOSCOPY    . CRANIOPLASTY  1999   Lytic skull lesion  . LAPAROSCOPIC HYSTERECTOMY  1996  . LAPAROSCOPIC SIGMOID COLECTOMY  2010   recurrent divertic  . LITHOTRIPSY  1991   with sling and stent  . LUMBAR DISC SURGERY  1993   discectomy  . POLYPECTOMY    . THYROIDECTOMY, PARTIAL Left 1982   Social History   Occupational History  . Not on file  Tobacco Use  . Smoking status: Never Smoker  . Smokeless tobacco: Never Used  Vaping Use  . Vaping Use: Never used  Substance and Sexual Activity  . Alcohol use: Yes    Alcohol/week: 12.0 standard drinks    Types: 12 Glasses of wine per week  . Drug use: No  . Sexual activity: Yes    Partners: Male    Birth control/protection: Post-menopausal, Surgical    Comment: hysterectomy

## 2019-09-28 ENCOUNTER — Other Ambulatory Visit: Payer: Self-pay

## 2019-09-28 DIAGNOSIS — M4807 Spinal stenosis, lumbosacral region: Secondary | ICD-10-CM

## 2019-10-06 ENCOUNTER — Other Ambulatory Visit: Payer: Self-pay | Admitting: Internal Medicine

## 2019-10-06 DIAGNOSIS — E049 Nontoxic goiter, unspecified: Secondary | ICD-10-CM

## 2019-10-27 ENCOUNTER — Ambulatory Visit
Admission: RE | Admit: 2019-10-27 | Discharge: 2019-10-27 | Disposition: A | Discharge status: Home / Self Care | Payer: Medicare PPO | Source: Ambulatory Visit | Attending: Internal Medicine | Admitting: Internal Medicine

## 2019-10-27 DIAGNOSIS — E049 Nontoxic goiter, unspecified: Secondary | ICD-10-CM

## 2019-10-29 ENCOUNTER — Ambulatory Visit
Admission: RE | Admit: 2019-10-29 | Discharge: 2019-10-29 | Disposition: A | Discharge status: Home / Self Care | Payer: Medicare PPO | Source: Ambulatory Visit | Attending: Orthopaedic Surgery | Admitting: Orthopaedic Surgery

## 2019-10-29 ENCOUNTER — Other Ambulatory Visit: Payer: Self-pay

## 2019-10-29 DIAGNOSIS — M4807 Spinal stenosis, lumbosacral region: Secondary | ICD-10-CM

## 2019-11-01 ENCOUNTER — Encounter: Payer: Self-pay | Admitting: Orthopaedic Surgery

## 2019-11-01 ENCOUNTER — Ambulatory Visit: Payer: Medicare PPO | Admitting: Orthopaedic Surgery

## 2019-11-01 VITALS — Ht 64.0 in | Wt 145.0 lb

## 2019-11-01 DIAGNOSIS — M4807 Spinal stenosis, lumbosacral region: Secondary | ICD-10-CM

## 2019-11-01 DIAGNOSIS — M5441 Lumbago with sciatica, right side: Secondary | ICD-10-CM

## 2019-11-01 NOTE — Progress Notes (Signed)
The patient is coming in for follow-up after having a MRI of her lumbar spine.  She has been having right-sided radicular symptoms that goes down her backside.  She does have remote history of a L5-S1 decompression done remotely.  She is still symptomatic but is improving a little bit.  She is on gabapentin 3 times a day.  On exam she still has a positive straight leg raise to the right side.  Her hip exam is entirely normal as is her foot and ankle exam.  She is very comfortable appearing and very active.  We did go over the MRI with her.  It does show anterolisthesis of 3 mm of L4 and L5 with facet joint disease and degenerative disc which may be causing lateral recess stenosis and affecting the nerves at that level.  She is very interested in outpatient physical therapy to see if there is any modalities that can improve the function of her back and decrease her pain.  We will set her up for outpatient physical therapy.  Another step would be considering a right-sided epidural steroid injection at L4-L5 if this fails.  All question concerns were answered and addressed.  We will set her up for outpatient physical therapy and then I will see her back in 4 weeks.

## 2019-11-02 ENCOUNTER — Other Ambulatory Visit: Payer: Self-pay

## 2019-11-02 DIAGNOSIS — M5441 Lumbago with sciatica, right side: Secondary | ICD-10-CM

## 2019-11-02 DIAGNOSIS — M4807 Spinal stenosis, lumbosacral region: Secondary | ICD-10-CM

## 2019-11-15 ENCOUNTER — Ambulatory Visit (INDEPENDENT_AMBULATORY_CARE_PROVIDER_SITE_OTHER): Payer: Medicare PPO | Admitting: Physical Therapy

## 2019-11-15 ENCOUNTER — Other Ambulatory Visit: Payer: Self-pay

## 2019-11-15 ENCOUNTER — Encounter: Payer: Self-pay | Admitting: Physical Therapy

## 2019-11-15 DIAGNOSIS — M5441 Lumbago with sciatica, right side: Secondary | ICD-10-CM | POA: Diagnosis not present

## 2019-11-15 DIAGNOSIS — G8929 Other chronic pain: Secondary | ICD-10-CM | POA: Diagnosis not present

## 2019-11-15 DIAGNOSIS — M6281 Muscle weakness (generalized): Secondary | ICD-10-CM

## 2019-11-15 NOTE — Patient Instructions (Signed)
Access Code: [email or web address] URL: [email or web address] Date: 11/15/2019 Prepared by: [name]  Exercises Single Knee to Chest Stretch - 2 x daily - 6 x weekly - 2 reps - 1 sets - 30 hold Supine Double Knee to Chest - 2 x daily - 6 x weekly - 3 reps - 1 sets - 30 hold Hooklying Sequential Leg March and Lower - 2 x daily - 6 x weekly - 3 sets - 10 reps Supine Active Straight Leg Raise - 2 x daily - 6 x weekly - 10 reps - 1-2 sets Supine Bridge - 2 x daily - 6 x weekly - 10 reps - 1-2 sets - 5 hold Seated Lumbar Flexion Stretch - 2 x daily - 6 x weekly - 1 sets - 10 reps - 10 hold

## 2019-11-15 NOTE — Therapy (Signed)
[facility] [address] [place], [place], [address] Phone: [phone]   Fax:  [phone]  Physical Therapy Evaluation  Patient Details  Name: Alexis Hull MRN: [id number] Date of Birth: November 18, 1945 Referring Provider (PT): Ninfa Linden   Encounter Date: 11/15/2019   PT End of Session - 11/15/19 1520    Visit Number 1    Number of Visits 12    Date for PT Re-Evaluation 02/07/20    Authorization Type [employer]    PT Start Time 7619    PT Stop Time 1515    PT Time Calculation (min) 43 min    Activity Tolerance Patient tolerated treatment well    Behavior During Therapy [facility] for tasks assessed/performed           Past Medical History:  Diagnosis Date  . Allergy   . Anxiety    rare   . Blood transfusion without reported diagnosis 1972   after miscarriage  . Cataract    both eyes   . Hyperlipidemia 2016   atorvastatin  . Hypothyroidism 1980's  . Osteopenia     Past Surgical History:  Procedure Laterality Date  . BREAST BIOPSY Right 1996   benign  . COLON SURGERY  2010  . COLONOSCOPY    . CRANIOPLASTY  1999   Lytic skull lesion  . LAPAROSCOPIC HYSTERECTOMY  1996  . LAPAROSCOPIC SIGMOID COLECTOMY  2010   recurrent divertic  . LITHOTRIPSY  1991   with sling and stent  . LUMBAR DISC SURGERY  1993   discectomy  . POLYPECTOMY    . THYROIDECTOMY, PARTIAL Left 1982    There were no vitals filed for this visit.    Subjective Assessment - 11/15/19 1436    Subjective LBP M54.41 with Rt Sciatica, Spinal stenosis of lumbar M48.Harvie Heck has been having right-sided radicular symptoms that goes down her backside. The Pain became a lot worse after moving.  She does have remote history of a L5-S1 decompression done remotely 1993.  She is still symptomatic but is improving a little bit.  She is on gabapentin 3 times a day. She relays overall the pain is better over the last couple weeks.    Pertinent History lumbar disectomy 1993., Osteopenia     Limitations Lifting;Standing;House hold activities    Diagnostic tests Lumbar MRI "anterolisthesis of 3 mm of L4 and L5 with facet joint disease and degenerative disc which may be causing lateral recess stenosis and affecting the nerves at that level."    Patient Stated Goals learn what exercises to do for my back    Currently in Pain? No/denies   relays no current back pain today, but was severe 3 weeks ago before taking gabapentin   Multiple Pain Sites No              [facility] PT Assessment - 11/15/19 0001      Assessment   Medical Diagnosis LBP M54.41 with Rt Sciatica, Spinal stenosis of lumbar M48.07    Referring Provider (PT) Ninfa Linden    Onset Date/Surgical Date --   acute on chronic pain new episode starting 09/15/19   Next MD Visit 11/30/19    Prior Therapy none      Precautions   Precautions None      Restrictions   Weight Bearing Restrictions No      Balance Screen   Has the patient fallen in the past 6 months Yes    How many times? 1   tripped   Has the  patient had a decrease in activity level because of a fear of falling?  No    Is the patient reluctant to leave their home because of a fear of falling?  No      Home Environment   Living Environment Private residence    Additional Comments has stairs but everything she needs is on one level      Prior Function   Level of Independence Independent    Leisure gardening      Cognition   Overall Cognitive Status Within Functional Limits for tasks assessed      Sensation   Light Touch Appears Intact      Posture/Postural Control   Posture Comments possible Rt lateral trunk shift?      ROM / Strength   AROM / PROM / Strength Strength;AROM      AROM   Overall AROM Comments no pain with lumbar AROM    AROM Assessment Site Lumbar    Lumbar Flexion 75%    Lumbar Extension 75%    Lumbar - Right Side Bend 75%    Lumbar - Left Side Bend 75%    Lumbar - Right Rotation 75%    Lumbar - Left Rotation 75%       Strength   Strength Assessment Site Knee;Hip : Tested in sitting   Right/Left Hip Right;Left    Right Hip Flexion 4-/5    Right Hip ABduction 4/5    Left Hip Flexion 4/5    Left Hip ABduction 4+/5    Right/Left Knee Right;Left    Right Knee Flexion 4/5    Right Knee Extension 5/5    Left Knee Flexion 5/5    Left Knee Extension 5/5      Flexibility   Soft Tissue Assessment /Muscle Length --   tight hamstrings and lumbar P.S. bilat     Special Tests   Other special tests neg SLR test, neg quadrant test, mildly + slump test bilat      Transfers   Transfers Independent with all Transfers      Ambulation/Gait   Ambulation/Gait Yes    Gait Comments WFL                      Objective measurements completed on examination: See above findings.       [facility] PT Treatment/Exercise - 11/15/19 0001      Exercises   Exercises Lumbar      Lumbar Exercises: Stretches   Single Knee to Chest Stretch Right;Left;2 reps;30 seconds    Double Knee to Chest Stretch 2 reps;30 seconds    Other Lumbar Stretch Exercise seated lumbar flexion stretch 10 sec X 5 reps fwd and 5 reps for diagonal to Lt      Lumbar Exercises: Supine   Bridge 10 reps;5 seconds    Straight Leg Raise 10 reps    Straight Leg Raises Limitations bilat    Other Supine Lumbar Exercises supine marches with contralateral isometric hold X 10 reps                  PT Education - 11/15/19 1520    Education Details HEP, POC    Person(s) Educated Patient    Methods Explanation;Demonstration;Verbal cues;Handout    Comprehension Verbalized understanding;Returned demonstration;Need further instruction;Other (comment)            PT Short Term Goals - 11/15/19 1526      PT SHORT TERM GOAL #1   Title  Pt will be I and compliant with HEP.    Time 4    Period Weeks    Status New    Target Date 12/13/19             PT Long Term Goals - 11/15/19 1526      PT LONG TERM GOAL #1   Title Pt will  improve lumbar ROM to [facility] >80% each plane    Time 12    Period Weeks    Status New    Target Date 02/07/20      PT LONG TERM GOAL #2   Title Pt will be able to garden, ambulate, and perform stairs without complaints of back pain.    Time 12    Period Weeks    Status New      PT LONG TERM GOAL #3   Title Pt will improve hip and core strength to at least 4+/5 MMT tested in sitting    Status New                  Plan - 11/15/19 1439    Clinical Impression Statement Pt presents with acute on chronic LBP with Rt sciatica and lumbar stenosis. Over the last 3 weeks pain has improved and she was not having pain today. She does have decreased lumbar ROM, decreased hip and core strength and difficulty with increased standing activity and stairs. She will benefit from skilled PT to address her deficits.    Personal Factors and Comorbidities Comorbidity 2    Comorbidities lumbar disectomy 1993., Osteopenia    Examination-Activity Limitations Carry;Squat;Stairs;Stand;Lift;Reach Overhead;Transfers    Examination-Participation Restrictions [employer];Shop;Cleaning;Community Activity    Stability/Clinical Decision Making Stable/Uncomplicated    Clinical Decision Making Low    PT Frequency 1x / week    PT Duration 12 weeks    PT Treatment/Interventions Cryotherapy;Electrical Stimulation;Iontophoresis 4mg /ml Dexamethasone;Moist Heat;Traction;Ultrasound;Neuromuscular re-education;Therapeutic exercise;Therapeutic activities;Stair training;Manual techniques;Passive range of motion;Dry needling;Joint Manipulations;Spinal Manipulations;Taping    PT Next Visit Plan review and update HEP PRN, consider traction, manual, or modalties for pain, plan for flexon based program due to stenosis and anteriorlisthesis    PT Home Exercise Plan Access Code: [email or web address]    Consulted and Agree with Plan of Care Patient           Patient will benefit from skilled therapeutic intervention in order to improve  the following deficits and impairments:  Decreased activity tolerance, Decreased strength, Decreased range of motion, Impaired flexibility, Postural dysfunction, Improper body mechanics, Pain  Visit Diagnosis: Chronic bilateral low back pain with right-sided sciatica  Muscle weakness (generalized)     Problem List Patient Active Problem List   Diagnosis Date Noted  . 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 Osteopenia 07/15/2013  . Multiple thyroid nodules 07/15/2013  . Unspecified hypothyroidism 07/15/2013  . DIVERTICULITIS OF COLON 05/26/2008  . HEMORRHAGE OF RECTUM AND ANUS 05/26/2008    Silvestre Mesi 11/15/2019, 3:29 PM  [facility] [address] [place], [place], [address] Phone: [phone]   Fax:  [phone]  Name: LARAY RIVKIN MRN: [id number] Date of Birth: 05-25-1945

## 2019-11-24 ENCOUNTER — Ambulatory Visit: Payer: Medicare PPO | Admitting: Physical Therapy

## 2019-11-24 ENCOUNTER — Other Ambulatory Visit: Payer: Self-pay

## 2019-11-24 DIAGNOSIS — G8929 Other chronic pain: Secondary | ICD-10-CM

## 2019-11-24 DIAGNOSIS — M5441 Lumbago with sciatica, right side: Secondary | ICD-10-CM

## 2019-11-24 DIAGNOSIS — M6281 Muscle weakness (generalized): Secondary | ICD-10-CM | POA: Diagnosis not present

## 2019-11-24 NOTE — Therapy (Addendum)
[facility] [address] [place], [place], [address] Phone: [phone]   Fax:  [phone]  Physical Therapy Treatment/Discharge addendum PHYSICAL THERAPY DISCHARGE SUMMARY  Visits from Start of Care: 2  Current functional level related to goals / functional outcomes: See below   Remaining deficits: See below   Education / Equipment: HEP Plan: Patient agrees to discharge.  Patient goals were met. Patient is being discharged due to being pleased with the current functional level.  ?????   She will continue with HEP [name], PT, DPT 01/18/20 8:36 AM     Patient Details  Name: Alexis Hull MRN: [id number] Date of Birth: 1945-05-11 Referring Provider (PT): Hedy Camara Date: 11/24/2019   PT End of Session - 11/24/19 1218    Visit Number 2    Number of Visits 12    Date for PT Re-Evaluation 02/07/20    Authorization Type [employer]    PT Start Time 1140    PT Stop Time 1218    PT Time Calculation (min) 38 min    Activity Tolerance Patient tolerated treatment well    Behavior During Therapy [facility] for tasks assessed/performed           Past Medical History:  Diagnosis Date  . Allergy   . Anxiety    rare   . Blood transfusion without reported diagnosis 1972   after miscarriage  . Cataract    both eyes   . Hyperlipidemia 2016   atorvastatin  . Hypothyroidism 1980's  . Osteopenia     Past Surgical History:  Procedure Laterality Date  . BREAST BIOPSY Right 1996   benign  . COLON SURGERY  2010  . COLONOSCOPY    . CRANIOPLASTY  1999   Lytic skull lesion  . LAPAROSCOPIC HYSTERECTOMY  1996  . LAPAROSCOPIC SIGMOID COLECTOMY  2010   recurrent divertic  . LITHOTRIPSY  1991   with sling and stent  . LUMBAR DISC SURGERY  1993   discectomy  . POLYPECTOMY    . THYROIDECTOMY, PARTIAL Left 1982    There were no vitals filed for this visit.   Subjective Assessment - 11/24/19 1148    Subjective She relays she  is doing well, no back pain or radiculopathy upon arrival. She has been doing the exercises but stopped doing the bridges becauase she noticed pain at night after doing them.    Pertinent History lumbar disectomy 1993., Osteopenia    Limitations Lifting;Standing;House hold activities    Diagnostic tests Lumbar MRI "anterolisthesis of 3 mm of L4 and L5 with facet joint disease and degenerative disc which may be causing lateral recess stenosis and affecting the nerves at that level."    Patient Stated Goals learn what exercises to do for my back    Currently in Pain? No/denies            [facility] Adult PT Treatment/Exercise - 11/24/19 0001      Lumbar Exercises: Stretches   Single Knee to Chest Stretch Right;Left;2 reps;30 seconds    Double Knee to Chest Stretch 2 reps;30 seconds    Lower Trunk Rotation 5 reps;10 seconds    Other Lumbar Stretch Exercise seated lumbar flexion stretch rolling stool out 10 sec X 10 reps      Lumbar Exercises: Aerobic   Nustep L6 UE/LE X 6 min      Lumbar Exercises: Standing   Row 20 reps    Theraband Level (Row) Level 2 (Red)    Shoulder  Extension 20 reps    Theraband Level (Shoulder Extension) Level 2 (Red)    Other Standing Lumbar Exercises deadlift with red band 2X10 reps      Lumbar Exercises: Supine   Bridge --   discontinued due to pain at home with this   Straight Leg Raise 20 reps    Straight Leg Raises Limitations bilat    Other Supine Lumbar Exercises supine marches with contralateral isometric hold X 20 reps                    PT Short Term Goals - 11/24/19 1221      PT SHORT TERM GOAL #1   Title Pt will be I and compliant with HEP.    Time 4    Period Weeks    Status Achieved    Target Date 12/13/19             PT Long Term Goals - 11/15/19 1526      PT LONG TERM GOAL #1   Title Pt will improve lumbar ROM to [facility] >80% each plane    Time 12    Period Weeks    Status New    Target Date 02/07/20      PT LONG TERM  GOAL #2   Title Pt will be able to garden, ambulate, and perform stairs without complaints of back pain.    Time 12    Period Weeks    Status New      PT LONG TERM GOAL #3   Title Pt will improve hip and core strength to at least 4+/5 MMT tested in sitting    Status New                 Plan - 11/24/19 1219    Clinical Impression Statement Overall has responded well to HEP and is not having back pain or radiculopathy at the moment. Updated and progressed HEP and she was encouraged to make one more PT visit if she felt necessary or she could trial HEP progression first and come back to PT PRN. She will also see MD next week and she can get their opinion if further PT is necessary.    Personal Factors and Comorbidities Comorbidity 2    Comorbidities lumbar disectomy 1993., Osteopenia    Examination-Activity Limitations Carry;Squat;Stairs;Stand;Lift;Reach Overhead;Transfers    Examination-Participation Restrictions [employer];Shop;Cleaning;Community Activity    Stability/Clinical Decision Making Stable/Uncomplicated    PT Frequency 1x / week    PT Duration 12 weeks    PT Treatment/Interventions Cryotherapy;Electrical Stimulation;Iontophoresis 69m/ml Dexamethasone;Moist Heat;Traction;Ultrasound;Neuromuscular re-education;Therapeutic exercise;Therapeutic activities;Stair training;Manual techniques;Passive range of motion;Dry needling;Joint Manipulations;Spinal Manipulations;Taping    PT Next Visit Plan review and update HEP PRN, consider traction, manual, or modalties for pain, plan for flexon based program due to stenosis and anteriorlisthesis    PT Home Exercise Plan Access Code: [email or web address]   Consulted and Agree with Plan of Care Patient           Patient will benefit from skilled therapeutic intervention in order to improve the following deficits and impairments:  Decreased activity tolerance, Decreased strength, Decreased range of motion, Impaired flexibility, Postural  dysfunction, Improper body mechanics, Pain  Visit Diagnosis: Chronic bilateral low back pain with right-sided sciatica  Muscle weakness (generalized)     Problem List Patient Active Problem List   Diagnosis Date Noted  . 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 Osteopenia 07/15/2013  . Multiple thyroid nodules 07/15/2013  . Unspecified hypothyroidism 07/15/2013  .  DIVERTICULITIS OF COLON 05/26/2008  . HEMORRHAGE OF RECTUM AND ANUS 05/26/2008    Silvestre Mesi 11/24/2019, 12:21 PM  [facility] [address] [place], [place], [address] Phone: [phone]   Fax:  [phone]  Name: AHJANAE CASSEL MRN: [id number] Date of Birth: 26-Mar-1946

## 2019-11-30 ENCOUNTER — Encounter: Payer: Self-pay | Admitting: Orthopaedic Surgery

## 2019-11-30 ENCOUNTER — Other Ambulatory Visit: Payer: Self-pay

## 2019-11-30 ENCOUNTER — Ambulatory Visit: Payer: Medicare PPO | Admitting: Orthopaedic Surgery

## 2019-11-30 DIAGNOSIS — M4807 Spinal stenosis, lumbosacral region: Secondary | ICD-10-CM | POA: Diagnosis not present

## 2019-11-30 NOTE — Progress Notes (Signed)
The patient is a 74 year old who comes in for follow-up after having physical therapy to treat low back pain with right-sided radicular symptoms.  She did have an MRI of her lumbar spine showing some stenosis.  She had a previous spinal intervention with surgery remotely.  She is on 300 mg of Neurontin three times a day.  She says she is doing well overall until this weekend and she was standing a lot and started have some pain shooting on her legs but now is getting better after some rest.  Overall, she feels like she is doing much better.  She points to her hamstrings as the main source of her pain with a straight leg raise but otherwise has 5 out of 5 strength of her bilateral lower extremities and normal sensation today.  We talked about her overall maintenance with her back.  She will continue home exercise program.  At this point she can try weaning from the Neurontin to see with stepwise increments of going down the Neurontin and decrease in this duration.  I did describe how to do this.  All question concerns were answered addressed.  Follow-up as needed.  If this does flareup again she will let us know.

## 2019-12-27 DIAGNOSIS — Z961 Presence of intraocular lens: Secondary | ICD-10-CM | POA: Diagnosis not present

## 2019-12-27 DIAGNOSIS — H26491 Other secondary cataract, right eye: Secondary | ICD-10-CM | POA: Diagnosis not present

## 2019-12-27 DIAGNOSIS — H26493 Other secondary cataract, bilateral: Secondary | ICD-10-CM | POA: Diagnosis not present

## 2020-01-03 DIAGNOSIS — H26491 Other secondary cataract, right eye: Secondary | ICD-10-CM | POA: Diagnosis not present

## 2020-01-04 ENCOUNTER — Other Ambulatory Visit: Payer: Self-pay | Admitting: Internal Medicine

## 2020-01-04 ENCOUNTER — Ambulatory Visit: Payer: Medicare PPO | Attending: Internal Medicine

## 2020-01-04 DIAGNOSIS — Z23 Encounter for immunization: Secondary | ICD-10-CM

## 2020-01-04 NOTE — Progress Notes (Signed)
° °  Covid-19 Vaccination Clinic  Name:  Alexis Hull    MRN: [id number] DOB: [birth year]  01/04/2020  [name] was observed post Covid-19 immunization for 15 minutes without incident. She was provided with Vaccine Information Sheet and instruction to access the V-Safe system.   [name] was instructed to call 911 with any severe reactions post vaccine:  Difficulty breathing   Swelling of face and throat   A fast heartbeat   A bad rash all over body   Dizziness and weakness

## 2020-01-13 ENCOUNTER — Other Ambulatory Visit: Payer: Self-pay | Admitting: Orthopaedic Surgery

## 2020-01-13 NOTE — Telephone Encounter (Signed)
Please advise 

## 2020-01-17 DIAGNOSIS — H26492 Other secondary cataract, left eye: Secondary | ICD-10-CM | POA: Diagnosis not present

## 2020-01-22 DIAGNOSIS — Z23 Encounter for immunization: Secondary | ICD-10-CM | POA: Diagnosis not present

## 2020-01-24 DIAGNOSIS — H26492 Other secondary cataract, left eye: Secondary | ICD-10-CM | POA: Diagnosis not present

## 2020-02-03 ENCOUNTER — Telehealth: Payer: Self-pay | Admitting: Orthopaedic Surgery

## 2020-02-03 DIAGNOSIS — M5441 Lumbago with sciatica, right side: Secondary | ICD-10-CM

## 2020-02-03 NOTE — Telephone Encounter (Signed)
Ok to send to Dr. Ernestina Patches for bilateral L4/L5 ESI.

## 2020-02-03 NOTE — Telephone Encounter (Signed)
Order put in and pt informed

## 2020-02-03 NOTE — Telephone Encounter (Signed)
Pt called stating her back pain has been persisting and Dr.Blackman mentioned referring her to a back Dr. For injections. The pt would like to know if Dr.Blackman will refer her or does he need to her again first?  [phone]

## 2020-02-03 NOTE — Telephone Encounter (Signed)
Please advise 

## 2020-03-06 DIAGNOSIS — M545 Low back pain, unspecified: Secondary | ICD-10-CM | POA: Diagnosis not present

## 2020-03-06 DIAGNOSIS — M5416 Radiculopathy, lumbar region: Secondary | ICD-10-CM | POA: Diagnosis not present

## 2020-03-06 DIAGNOSIS — G8929 Other chronic pain: Secondary | ICD-10-CM | POA: Diagnosis not present

## 2020-03-06 DIAGNOSIS — M7062 Trochanteric bursitis, left hip: Secondary | ICD-10-CM | POA: Diagnosis not present

## 2020-05-01 DIAGNOSIS — M7062 Trochanteric bursitis, left hip: Secondary | ICD-10-CM | POA: Diagnosis not present

## 2020-05-01 DIAGNOSIS — M5416 Radiculopathy, lumbar region: Secondary | ICD-10-CM | POA: Diagnosis not present

## 2020-05-01 DIAGNOSIS — M5136 Other intervertebral disc degeneration, lumbar region: Secondary | ICD-10-CM | POA: Diagnosis not present

## 2020-05-01 DIAGNOSIS — G8929 Other chronic pain: Secondary | ICD-10-CM | POA: Diagnosis not present

## 2020-05-16 DIAGNOSIS — L814 Other melanin hyperpigmentation: Secondary | ICD-10-CM | POA: Diagnosis not present

## 2020-05-16 DIAGNOSIS — D1801 Hemangioma of skin and subcutaneous tissue: Secondary | ICD-10-CM | POA: Diagnosis not present

## 2020-05-16 DIAGNOSIS — L821 Other seborrheic keratosis: Secondary | ICD-10-CM | POA: Diagnosis not present

## 2020-05-16 DIAGNOSIS — L570 Actinic keratosis: Secondary | ICD-10-CM | POA: Diagnosis not present

## 2020-05-16 DIAGNOSIS — D225 Melanocytic nevi of trunk: Secondary | ICD-10-CM | POA: Diagnosis not present

## 2020-05-16 DIAGNOSIS — I788 Other diseases of capillaries: Secondary | ICD-10-CM | POA: Diagnosis not present

## 2020-10-03 DIAGNOSIS — E042 Nontoxic multinodular goiter: Secondary | ICD-10-CM | POA: Diagnosis not present

## 2020-10-03 DIAGNOSIS — E785 Hyperlipidemia, unspecified: Secondary | ICD-10-CM | POA: Diagnosis not present

## 2020-10-03 DIAGNOSIS — R7301 Impaired fasting glucose: Secondary | ICD-10-CM | POA: Diagnosis not present

## 2020-10-03 DIAGNOSIS — M859 Disorder of bone density and structure, unspecified: Secondary | ICD-10-CM | POA: Diagnosis not present

## 2020-10-06 DIAGNOSIS — Z1212 Encounter for screening for malignant neoplasm of rectum: Secondary | ICD-10-CM | POA: Diagnosis not present

## 2020-10-10 DIAGNOSIS — R82998 Other abnormal findings in urine: Secondary | ICD-10-CM | POA: Diagnosis not present

## 2020-10-10 DIAGNOSIS — R7301 Impaired fasting glucose: Secondary | ICD-10-CM | POA: Diagnosis not present

## 2020-10-10 DIAGNOSIS — M5416 Radiculopathy, lumbar region: Secondary | ICD-10-CM | POA: Diagnosis not present

## 2020-10-10 DIAGNOSIS — Z0000 Encounter for general adult medical examination without abnormal findings: Secondary | ICD-10-CM | POA: Diagnosis not present

## 2020-10-10 DIAGNOSIS — Z1331 Encounter for screening for depression: Secondary | ICD-10-CM | POA: Diagnosis not present

## 2020-10-10 DIAGNOSIS — M8580 Other specified disorders of bone density and structure, unspecified site: Secondary | ICD-10-CM | POA: Diagnosis not present

## 2020-10-10 DIAGNOSIS — E042 Nontoxic multinodular goiter: Secondary | ICD-10-CM | POA: Diagnosis not present

## 2020-10-10 DIAGNOSIS — Z1339 Encounter for screening examination for other mental health and behavioral disorders: Secondary | ICD-10-CM | POA: Diagnosis not present

## 2020-10-10 DIAGNOSIS — N952 Postmenopausal atrophic vaginitis: Secondary | ICD-10-CM | POA: Diagnosis not present

## 2020-10-10 DIAGNOSIS — E785 Hyperlipidemia, unspecified: Secondary | ICD-10-CM | POA: Diagnosis not present

## 2020-10-10 DIAGNOSIS — F419 Anxiety disorder, unspecified: Secondary | ICD-10-CM | POA: Diagnosis not present

## 2020-10-10 DIAGNOSIS — E890 Postprocedural hypothyroidism: Secondary | ICD-10-CM | POA: Diagnosis not present

## 2020-11-07 DIAGNOSIS — I83813 Varicose veins of bilateral lower extremities with pain: Secondary | ICD-10-CM | POA: Diagnosis not present

## 2020-11-27 DIAGNOSIS — Z1231 Encounter for screening mammogram for malignant neoplasm of breast: Secondary | ICD-10-CM | POA: Diagnosis not present

## 2020-12-14 DIAGNOSIS — L820 Inflamed seborrheic keratosis: Secondary | ICD-10-CM | POA: Diagnosis not present

## 2020-12-15 DIAGNOSIS — I83813 Varicose veins of bilateral lower extremities with pain: Secondary | ICD-10-CM | POA: Diagnosis not present

## 2020-12-20 DIAGNOSIS — I83813 Varicose veins of bilateral lower extremities with pain: Secondary | ICD-10-CM | POA: Diagnosis not present

## 2020-12-20 DIAGNOSIS — I83893 Varicose veins of bilateral lower extremities with other complications: Secondary | ICD-10-CM | POA: Diagnosis not present

## 2021-02-05 DIAGNOSIS — H04123 Dry eye syndrome of bilateral lacrimal glands: Secondary | ICD-10-CM | POA: Diagnosis not present

## 2021-02-05 DIAGNOSIS — H26493 Other secondary cataract, bilateral: Secondary | ICD-10-CM | POA: Diagnosis not present

## 2021-02-09 DIAGNOSIS — D485 Neoplasm of uncertain behavior of skin: Secondary | ICD-10-CM | POA: Diagnosis not present

## 2021-02-09 DIAGNOSIS — D045 Carcinoma in situ of skin of trunk: Secondary | ICD-10-CM | POA: Diagnosis not present

## 2021-02-09 DIAGNOSIS — L814 Other melanin hyperpigmentation: Secondary | ICD-10-CM | POA: Diagnosis not present

## 2021-02-09 DIAGNOSIS — L821 Other seborrheic keratosis: Secondary | ICD-10-CM | POA: Diagnosis not present

## 2021-02-09 DIAGNOSIS — L820 Inflamed seborrheic keratosis: Secondary | ICD-10-CM | POA: Diagnosis not present

## 2021-02-09 DIAGNOSIS — L603 Nail dystrophy: Secondary | ICD-10-CM | POA: Diagnosis not present

## 2021-02-09 DIAGNOSIS — D1801 Hemangioma of skin and subcutaneous tissue: Secondary | ICD-10-CM | POA: Diagnosis not present

## 2021-02-09 DIAGNOSIS — L570 Actinic keratosis: Secondary | ICD-10-CM | POA: Diagnosis not present

## 2021-02-28 DIAGNOSIS — D045 Carcinoma in situ of skin of trunk: Secondary | ICD-10-CM | POA: Diagnosis not present

## 2021-03-05 DIAGNOSIS — I83892 Varicose veins of left lower extremities with other complications: Secondary | ICD-10-CM | POA: Diagnosis not present

## 2021-03-07 DIAGNOSIS — I83892 Varicose veins of left lower extremities with other complications: Secondary | ICD-10-CM | POA: Diagnosis not present

## 2021-03-12 DIAGNOSIS — I83812 Varicose veins of left lower extremities with pain: Secondary | ICD-10-CM | POA: Diagnosis not present

## 2021-03-12 DIAGNOSIS — Z09 Encounter for follow-up examination after completed treatment for conditions other than malignant neoplasm: Secondary | ICD-10-CM | POA: Diagnosis not present

## 2021-04-11 DIAGNOSIS — M7989 Other specified soft tissue disorders: Secondary | ICD-10-CM | POA: Diagnosis not present

## 2021-04-11 DIAGNOSIS — I83892 Varicose veins of left lower extremities with other complications: Secondary | ICD-10-CM | POA: Diagnosis not present

## 2021-04-25 DIAGNOSIS — I87392 Chronic venous hypertension (idiopathic) with other complications of left lower extremity: Secondary | ICD-10-CM | POA: Diagnosis not present

## 2021-04-25 DIAGNOSIS — I83892 Varicose veins of left lower extremities with other complications: Secondary | ICD-10-CM | POA: Diagnosis not present

## 2021-05-28 DIAGNOSIS — I83891 Varicose veins of right lower extremities with other complications: Secondary | ICD-10-CM | POA: Diagnosis not present

## 2021-05-30 DIAGNOSIS — I83813 Varicose veins of bilateral lower extremities with pain: Secondary | ICD-10-CM | POA: Diagnosis not present

## 2021-05-30 DIAGNOSIS — I83892 Varicose veins of left lower extremities with other complications: Secondary | ICD-10-CM | POA: Diagnosis not present

## 2021-05-30 DIAGNOSIS — Z09 Encounter for follow-up examination after completed treatment for conditions other than malignant neoplasm: Secondary | ICD-10-CM | POA: Diagnosis not present

## 2021-06-20 DIAGNOSIS — I87391 Chronic venous hypertension (idiopathic) with other complications of right lower extremity: Secondary | ICD-10-CM | POA: Diagnosis not present

## 2021-06-20 DIAGNOSIS — I83891 Varicose veins of right lower extremities with other complications: Secondary | ICD-10-CM | POA: Diagnosis not present

## 2021-07-02 DIAGNOSIS — I83811 Varicose veins of right lower extremities with pain: Secondary | ICD-10-CM | POA: Diagnosis not present

## 2021-07-02 DIAGNOSIS — I83891 Varicose veins of right lower extremities with other complications: Secondary | ICD-10-CM | POA: Diagnosis not present

## 2021-07-02 DIAGNOSIS — M7989 Other specified soft tissue disorders: Secondary | ICD-10-CM | POA: Diagnosis not present

## 2021-07-16 DIAGNOSIS — L608 Other nail disorders: Secondary | ICD-10-CM | POA: Diagnosis not present

## 2021-07-16 DIAGNOSIS — M7661 Achilles tendinitis, right leg: Secondary | ICD-10-CM | POA: Diagnosis not present

## 2021-10-01 DIAGNOSIS — I83813 Varicose veins of bilateral lower extremities with pain: Secondary | ICD-10-CM | POA: Diagnosis not present

## 2021-10-01 DIAGNOSIS — I872 Venous insufficiency (chronic) (peripheral): Secondary | ICD-10-CM | POA: Diagnosis not present

## 2021-10-01 DIAGNOSIS — M7989 Other specified soft tissue disorders: Secondary | ICD-10-CM | POA: Diagnosis not present

## 2021-10-16 DIAGNOSIS — L910 Hypertrophic scar: Secondary | ICD-10-CM | POA: Diagnosis not present

## 2021-10-16 DIAGNOSIS — L821 Other seborrheic keratosis: Secondary | ICD-10-CM | POA: Diagnosis not present

## 2021-10-16 DIAGNOSIS — Z85828 Personal history of other malignant neoplasm of skin: Secondary | ICD-10-CM | POA: Diagnosis not present

## 2021-10-16 DIAGNOSIS — L700 Acne vulgaris: Secondary | ICD-10-CM | POA: Diagnosis not present

## 2021-10-16 DIAGNOSIS — L308 Other specified dermatitis: Secondary | ICD-10-CM | POA: Diagnosis not present

## 2021-10-18 DIAGNOSIS — M8589 Other specified disorders of bone density and structure, multiple sites: Secondary | ICD-10-CM | POA: Diagnosis not present

## 2021-10-25 ENCOUNTER — Encounter: Payer: Self-pay | Admitting: Gastroenterology

## 2021-10-25 DIAGNOSIS — R7301 Impaired fasting glucose: Secondary | ICD-10-CM | POA: Diagnosis not present

## 2021-10-25 DIAGNOSIS — F419 Anxiety disorder, unspecified: Secondary | ICD-10-CM | POA: Diagnosis not present

## 2021-10-25 DIAGNOSIS — E785 Hyperlipidemia, unspecified: Secondary | ICD-10-CM | POA: Diagnosis not present

## 2021-10-25 DIAGNOSIS — E042 Nontoxic multinodular goiter: Secondary | ICD-10-CM | POA: Diagnosis not present

## 2021-10-25 DIAGNOSIS — R7989 Other specified abnormal findings of blood chemistry: Secondary | ICD-10-CM | POA: Diagnosis not present

## 2021-10-25 DIAGNOSIS — Z79899 Other long term (current) drug therapy: Secondary | ICD-10-CM | POA: Diagnosis not present

## 2021-10-29 ENCOUNTER — Encounter: Payer: Self-pay | Admitting: Gastroenterology

## 2021-10-30 DIAGNOSIS — R7989 Other specified abnormal findings of blood chemistry: Secondary | ICD-10-CM | POA: Diagnosis not present

## 2021-10-30 DIAGNOSIS — L608 Other nail disorders: Secondary | ICD-10-CM | POA: Diagnosis not present

## 2021-10-31 DIAGNOSIS — R82998 Other abnormal findings in urine: Secondary | ICD-10-CM | POA: Diagnosis not present

## 2021-10-31 DIAGNOSIS — R7301 Impaired fasting glucose: Secondary | ICD-10-CM | POA: Diagnosis not present

## 2021-10-31 DIAGNOSIS — Z0000 Encounter for general adult medical examination without abnormal findings: Secondary | ICD-10-CM | POA: Diagnosis not present

## 2021-10-31 DIAGNOSIS — M8580 Other specified disorders of bone density and structure, unspecified site: Secondary | ICD-10-CM | POA: Diagnosis not present

## 2021-10-31 DIAGNOSIS — E042 Nontoxic multinodular goiter: Secondary | ICD-10-CM | POA: Diagnosis not present

## 2021-10-31 DIAGNOSIS — F419 Anxiety disorder, unspecified: Secondary | ICD-10-CM | POA: Diagnosis not present

## 2021-10-31 DIAGNOSIS — R7989 Other specified abnormal findings of blood chemistry: Secondary | ICD-10-CM | POA: Diagnosis not present

## 2021-10-31 DIAGNOSIS — E890 Postprocedural hypothyroidism: Secondary | ICD-10-CM | POA: Diagnosis not present

## 2021-10-31 DIAGNOSIS — M5416 Radiculopathy, lumbar region: Secondary | ICD-10-CM | POA: Diagnosis not present

## 2021-10-31 DIAGNOSIS — E785 Hyperlipidemia, unspecified: Secondary | ICD-10-CM | POA: Diagnosis not present

## 2021-11-21 ENCOUNTER — Other Ambulatory Visit: Payer: Self-pay | Admitting: Gastroenterology

## 2021-11-21 ENCOUNTER — Ambulatory Visit (AMBULATORY_SURGERY_CENTER): Payer: Self-pay

## 2021-11-21 VITALS — Ht 64.0 in | Wt 143.0 lb

## 2021-11-21 DIAGNOSIS — Z800 Family history of malignant neoplasm of digestive organs: Secondary | ICD-10-CM

## 2021-11-21 DIAGNOSIS — Z86010 Personal history of colonic polyps: Secondary | ICD-10-CM

## 2021-11-21 MED ORDER — PLENVU 140 G PO SOLR: 1.0000 | Freq: Once | ORAL | 0 refills | Status: AC

## 2021-11-21 MED ORDER — ONDANSETRON HCL 4 MG PO TABS: 4.0000 mg | ORAL_TABLET | ORAL | 0 refills | Status: AC

## 2021-11-21 NOTE — Progress Notes (Addendum)
No egg or soy allergy known to patient  No issues known to pt with past sedation with any surgeries or procedures Patient denies ever being told they had issues or difficulty with intubation  No FH of Malignant Hyperthermia Pt is not on diet pills Pt is not on  home 02  Pt is not on blood thinners  Pt denies issues with constipation  No A fib or A flutter Have any cardiac testing pending--denied Pt instructed to use [employer] or GoodRx for a price reduction on prep   Pt reports severe N/V with suprep, requests alternative. Plenvu rx to pharmacy and coupon to pt. If she finds cost of Plenvu is exorbitant , pt will call back for Suprep Rx and coupon with instructions will be mailed.

## 2021-11-28 DIAGNOSIS — Z1231 Encounter for screening mammogram for malignant neoplasm of breast: Secondary | ICD-10-CM | POA: Diagnosis not present

## 2021-12-05 ENCOUNTER — Encounter: Payer: Self-pay | Admitting: Gastroenterology

## 2021-12-19 ENCOUNTER — Ambulatory Visit (AMBULATORY_SURGERY_CENTER): Payer: Medicare PPO | Admitting: Gastroenterology

## 2021-12-19 ENCOUNTER — Encounter: Payer: Self-pay | Admitting: Gastroenterology

## 2021-12-19 VITALS — BP 133/63 | HR 56 | Temp 97.3°F | Resp 10 | Ht 64.0 in | Wt 143.0 lb

## 2021-12-19 DIAGNOSIS — Z09 Encounter for follow-up examination after completed treatment for conditions other than malignant neoplasm: Secondary | ICD-10-CM

## 2021-12-19 DIAGNOSIS — Z800 Family history of malignant neoplasm of digestive organs: Secondary | ICD-10-CM | POA: Diagnosis not present

## 2021-12-19 DIAGNOSIS — D120 Benign neoplasm of cecum: Secondary | ICD-10-CM

## 2021-12-19 DIAGNOSIS — Z86010 Personal history of colonic polyps: Secondary | ICD-10-CM

## 2021-12-19 MED ORDER — SODIUM CHLORIDE 0.9 % IV SOLN: 500.0000 mL | Freq: Once | INTRAVENOUS | Status: DC

## 2021-12-19 NOTE — Progress Notes (Unsigned)
[facility] History and Physical   Primary Care Physician:  Ginger Organ., MD   Reason for Procedure:  History of adenomatous colon polyps, family h/o colon cancer  Plan:    Surveillance colonoscopy with possible interventions as needed     HPI: Alexis Hull is a very pleasant 76 y.o. female here for surveillance colonoscopy. Denies any nausea, vomiting, abdominal pain, melena or bright red blood per rectum  The risks and benefits as well as alternatives of endoscopic procedure(s) have been discussed and reviewed. All questions answered. The patient agrees to proceed.    Past Medical History:  Diagnosis Date   Allergy    Anxiety    rare    Blood transfusion without reported diagnosis 1972   after miscarriage   Cataract    both eyes    Hyperlipidemia 2016   atorvastatin   Hypothyroidism 1980's   Osteopenia     Past Surgical History:  Procedure Laterality Date   BREAST BIOPSY Right 1996   benign   COLON SURGERY  2010   COLONOSCOPY     CRANIOPLASTY  1999   Lytic skull lesion   LAPAROSCOPIC HYSTERECTOMY  1996   LAPAROSCOPIC SIGMOID COLECTOMY  2010   recurrent divertic   LITHOTRIPSY  1991   with sling and stent   [place]   discectomy   POLYPECTOMY     THYROIDECTOMY, PARTIAL Left 1982    Prior to Admission medications   Medication Sig Start Date End Date Taking? Authorizing Provider  atorvastatin (LIPITOR) 10 MG tablet Take 10 mg by mouth at bedtime. 03/02/15  Yes [provider]  Biotin 5000 MCG TABS Take 1 tablet by mouth daily.   Yes [provider]  Calcium Carb-Cholecalciferol (CALCIUM 600 + D PO) Take by mouth daily. 2 tablets daily   Yes [provider]  conjugated estrogens (PREMARIN) vaginal cream Use 1 g vaginally two times a week 07/15/17  Yes Regina Eck, CNM  GLUCOSAMINE-CHONDROITIN PO Take by mouth.   Yes [provider]  levothyroxine (SYNTHROID, LEVOTHROID) 50 MCG  tablet Take 50 mcg by mouth every evening.    Yes [provider]  loratadine (CLARITIN) 10 MG tablet Take 10 mg by mouth daily.   Yes [provider]  Lysine 1000 MG TABS Take by mouth.   Yes [provider]  magnesium citrate SOLN Take 1 Bottle by mouth once.   Yes [provider]  Methylcellulose, Laxative, (CITRUCEL) 500 MG TABS Take by mouth daily. Citrucel   Yes [provider]  Multiple Vitamins-Minerals (HAIR/SKIN/NAILS PO) Take 1 tablet by mouth daily.   Yes [provider]  ondansetron (ZOFRAN) 4 MG tablet Take 1 tablet (4 mg total) by mouth as directed. Take 1 tab 30-60 minutes prior to each prep dose 11/21/21  Yes Tallia Moehring, Venia Minks, MD  ALPRAZolam (XANAX) 0.25 MG tablet Take 0.25 mg by mouth at bedtime as needed for anxiety.    [provider]  gabapentin (NEURONTIN) 300 MG capsule TAKE 1 CAPSULE BY MOUTH THREE TIMES A DAY Patient not taking: Reported on 11/21/2021 01/13/20   Mcarthur Rossetti, MD    Current Outpatient Medications  Medication Sig Dispense Refill   atorvastatin (LIPITOR) 10 MG tablet Take 10 mg by mouth at bedtime.  6   Biotin 5000 MCG TABS Take 1 tablet by mouth daily.     Calcium Carb-Cholecalciferol (CALCIUM 600 + D PO) Take by mouth daily. 2 tablets daily  conjugated estrogens (PREMARIN) vaginal cream Use 1 g vaginally two times a week 30 g 6   GLUCOSAMINE-CHONDROITIN PO Take by mouth.     levothyroxine (SYNTHROID, LEVOTHROID) 50 MCG tablet Take 50 mcg by mouth every evening.      loratadine (CLARITIN) 10 MG tablet Take 10 mg by mouth daily.     Lysine 1000 MG TABS Take by mouth.     magnesium citrate SOLN Take 1 Bottle by mouth once.     Methylcellulose, Laxative, (CITRUCEL) 500 MG TABS Take by mouth daily. Citrucel     Multiple Vitamins-Minerals (HAIR/SKIN/NAILS PO) Take 1 tablet by mouth daily.     ondansetron (ZOFRAN) 4 MG tablet Take 1 tablet (4 mg total) by mouth as directed. Take 1  tab 30-60 minutes prior to each prep dose 2 tablet 0   ALPRAZolam (XANAX) 0.25 MG tablet Take 0.25 mg by mouth at bedtime as needed for anxiety.     gabapentin (NEURONTIN) 300 MG capsule TAKE 1 CAPSULE BY MOUTH THREE TIMES A DAY (Patient not taking: Reported on 11/21/2021) 90 capsule 1   Current Facility-Administered Medications  Medication Dose Route Frequency Provider Last Rate Last Admin   0.9 %  sodium chloride infusion  500 mL Intravenous Once Mauri Pole, MD        Allergies as of 12/19/2021 - Review Complete 12/19/2021  Allergen Reaction Noted   Celebrex [celecoxib] Hives 07/14/2013   Morphine and related Itching 06/25/2013   Tape  06/25/2013   Penicillins Rash 05/26/2008    Family History  Problem Relation Age of Onset   Colon cancer Mother 68   Stroke Mother    Aneurysm Mother        brain   Breast cancer Mother    Diabetes Father    Heart attack Father    Colon polyps Brother    Hypertension Brother    Colon cancer Maternal Grandmother        not sure age of onset   Esophageal cancer Neg Hx    Rectal cancer Neg Hx    Stomach cancer Neg Hx     Social History   Socioeconomic History   Marital status: Married    Spouse name: Not on file   Number of children: 0   Years of education: Not on file   Highest education level: Not on file  Occupational History   Not on file  Tobacco Use   Smoking status: Never   Smokeless tobacco: Never  Vaping Use   Vaping Use: Never used  Substance and Sexual Activity   Alcohol use: Yes    Alcohol/week: 12.0 standard drinks of alcohol    Types: 12 Glasses of wine per week   Drug use: No   Sexual activity: Yes    Partners: Male    Birth control/protection: Post-menopausal, Surgical    Comment: hysterectomy  Other Topics Concern   Not on file  Social History Narrative   Not on file   Social Determinants of Health   Financial Resource Strain: Not on file  Food Insecurity: Not on file  Transportation Needs: Not  on file  Physical Activity: Not on file  Stress: Not on file  Social Connections: Not on file  Intimate Partner Violence: Not on file    Review of Systems:  All other review of systems negative except as mentioned in the HPI.  Physical Exam: Vital signs in last 24 hours: Blood Pressure 136/71   Pulse 62   Temperature (Abnormal)  97.3 F (36.3 C)   Height '5\' 4"'$  (1.626 m)   Weight 143 lb (64.9 kg)   Last Menstrual Period 08/31/1994 (Approximate)   Oxygen Saturation 100%   Body Mass Index 24.55 kg/m  General:   Alert, NAD Lungs:  Clear .   Heart:  Regular rate and rhythm Abdomen:  Soft, nontender and nondistended. Neuro/Psych:  Alert and cooperative. Normal mood and affect. A and O x 3  Reviewed labs, radiology imaging, old records and pertinent past GI work up  Patient is appropriate for planned procedure(s) and anesthesia in an ambulatory setting   K. Denzil Magnuson , MD [phone]

## 2021-12-19 NOTE — Op Note (Signed)
[place] Patient Name: Alexis Hull Procedure Date: 12/19/2021 8:47 AM MRN: [id number] Endoscopist: Mauri Pole , MD Age: 76 Referring MD:  Date of Birth: December 07, 1945 Gender: Female Account #: [id number] Procedure:                Colonoscopy Indications:              Screening in patient at increased risk: Family                            history of 1st-degree relative with colorectal                            cancer, High risk colon cancer surveillance:                            Personal history of colonic polyps, High risk colon                            cancer surveillance: Personal history of adenoma                            (10 mm or greater in size), High risk colon cancer                            surveillance: Personal history of multiple (3 or                            more) adenomas Medicines:                Monitored Anesthesia Care Procedure:                Pre-Anesthesia Assessment:                           - Prior to the procedure, a History and Physical                            was performed, and patient medications and                            allergies were reviewed. The patient's tolerance of                            previous anesthesia was also reviewed. The risks                            and benefits of the procedure and the sedation                            options and risks were discussed with the patient.                            All questions were answered, and informed consent  was obtained. Prior Anticoagulants: The patient has                            taken no previous anticoagulant or antiplatelet                            agents. ASA Grade Assessment: II - A patient with                            mild systemic disease. After reviewing the risks                            and benefits, the patient was deemed in                            satisfactory condition to undergo the  procedure.                           After obtaining informed consent, the colonoscope                            was passed under direct vision. Throughout the                            procedure, the patient's blood pressure, pulse, and                            oxygen saturations were monitored continuously. The                            Olympus PCF-H190DL (#[id number]) Colonoscope was                            introduced through the anus and advanced to the the                            cecum, identified by appendiceal orifice and                            ileocecal valve. The colonoscopy was performed                            without difficulty. The patient tolerated the                            procedure well. The quality of the bowel                            preparation was good. The ileocecal valve,                            appendiceal orifice, and rectum were photographed. Scope In: 8:53:12 AM Scope Out: 9:05:04 AM Scope Withdrawal Time: 0 hours 8 minutes 17 seconds  Total Procedure Duration: 0  hours 11 minutes 52 seconds  Findings:                 The perianal and digital rectal examinations were                            normal.                           A less than 1 mm polyp was found in the ileocecal                            valve. The polyp was sessile. The polyp was removed                            with a cold biopsy forceps. Resection and retrieval                            were complete.                           Scattered small and large-mouthed diverticula were                            found in the descending colon, transverse colon,                            ascending colon and cecum.                           There was evidence of a prior functional end-to-end                            colo-colonic anastomosis in the sigmoid colon. This                            was patent and was characterized by healthy                            appearing  mucosa. The anastomosis was traversed.                           Non-bleeding external and internal hemorrhoids were                            found during retroflexion. The hemorrhoids were                            small. Complications:            No immediate complications. Estimated Blood Loss:     Estimated blood loss was minimal. Impression:               - One less than 1 mm polyp at the ileocecal valve,  removed with a cold biopsy forceps. Resected and                            retrieved.                           - Moderate diverticulosis in the descending colon,                            in the transverse colon, in the ascending colon and                            in the cecum.                           - Patent functional end-to-end colo-colonic                            anastomosis, characterized by healthy appearing                            mucosa.                           - Non-bleeding external and internal hemorrhoids. Recommendation:           - Patient has a contact number available for                            emergencies. The signs and symptoms of potential                            delayed complications were discussed with the                            patient. Return to normal activities tomorrow.                            Written discharge instructions were provided to the                            patient.                           - Resume previous diet.                           - Continue present medications.                           - Await pathology results.                           - No repeat colonoscopy due to age. Mauri Pole, MD 12/19/2021 9:11:25 AM This report has been signed electronically.

## 2021-12-19 NOTE — Patient Instructions (Signed)
Await pathology results.  Handout on polyps, diverticulosis, and hemorrhoids provided.  YOU HAD AN ENDOSCOPIC PROCEDURE TODAY AT THE [facility]:   Refer to the procedure report that was given to you for any specific questions about what was found during the examination.  If the procedure report does not answer your questions, please call your gastroenterologist to clarify.  If you requested that your care partner not be given the details of your procedure findings, then the procedure report has been included in a sealed envelope for you to review at your convenience later.  YOU SHOULD EXPECT: Some feelings of bloating in the abdomen. Passage of more gas than usual.  Walking can help get rid of the air that was put into your GI tract during the procedure and reduce the bloating. If you had a lower endoscopy (such as a colonoscopy or flexible sigmoidoscopy) you may notice spotting of blood in your stool or on the toilet paper. If you underwent a bowel prep for your procedure, you may not have a normal bowel movement for a few days.  Please Note:  You might notice some irritation and congestion in your nose or some drainage.  This is from the oxygen used during your procedure.  There is no need for concern and it should clear up in a day or so.  SYMPTOMS TO REPORT IMMEDIATELY:  Following lower endoscopy (colonoscopy or flexible sigmoidoscopy):  Excessive amounts of blood in the stool  Significant tenderness or worsening of abdominal pains  Swelling of the abdomen that is new, acute  Fever of 100F or higher  For urgent or emergent issues, a gastroenterologist can be reached at any hour by calling [phone]. Do not use MyChart messaging for urgent concerns.    DIET:  We do recommend a small meal at first, but then you may proceed to your regular diet.  Drink plenty of fluids but you should avoid alcoholic beverages for 24 hours.  ACTIVITY:  You should plan to take it easy for  the rest of today and you should NOT DRIVE or use heavy machinery until tomorrow (because of the sedation medicines used during the test).    FOLLOW UP: Our staff will call the number listed on your records the next business day following your procedure.  We will call around 7:15- 8:00 am to check on you and address any questions or concerns that you may have regarding the information given to you following your procedure. If we do not reach you, we will leave a message.     If any biopsies were taken you will be contacted by phone or by letter within the next 1-3 weeks.  Please call us at [phone] if you have not heard about the biopsies in 3 weeks.    SIGNATURES/CONFIDENTIALITY: You and/or your care partner have signed paperwork which will be entered into your electronic medical record.  These signatures attest to the fact that that the information above on your After Visit Summary has been reviewed and is understood.  Full responsibility of the confidentiality of this discharge information lies with you and/or your care-partner.  

## 2021-12-19 NOTE — Progress Notes (Unsigned)
Called to room to assist during endoscopic procedure.  Patient ID and intended procedure confirmed with present staff. Received instructions for my participation in the procedure from the performing physician.  

## 2021-12-19 NOTE — Progress Notes (Unsigned)
Sedate, gd SR, tolerated procedure well, VSS, report to RN 

## 2021-12-19 NOTE — Progress Notes (Unsigned)
Pt's states no medical or surgical changes since previsit or office visit. 

## 2021-12-20 ENCOUNTER — Telehealth: Payer: Self-pay | Admitting: *Deleted

## 2021-12-20 NOTE — Telephone Encounter (Signed)
  Follow up Call-     12/19/2021    7:40 AM  Call back number  Post procedure Call Back phone  # [phone]  Permission to leave phone message Yes     Patient questions:  Do you have a fever, pain , or abdominal swelling? No. Pain Score  0 *  Have you tolerated food without any problems? Yes.    Have you been able to return to your normal activities? Yes.    Do you have any questions about your discharge instructions: Diet   No. Medications  No. Follow up visit  No.  Do you have questions or concerns about your Care? No.  Actions: * If pain score is 4 or above: No action needed, pain <4.

## 2022-01-03 ENCOUNTER — Encounter: Payer: Self-pay | Admitting: Gastroenterology

## 2022-01-12 IMAGING — US US THYROID
1 series · 13 of 25 positions shown · non-contrast
Comparison: 10/07/2014

CLINICAL DATA: 74-year-old female with a history of multinodular
thyroid

EXAM:
THYROID ULTRASOUND
TECHNIQUE: Ultrasound examination of the thyroid gland and adjacent soft
tissues was performed.

[Series 1: us thyroid · 0.06mm/px · 13 of 34 slices shown]
[im 1/34]
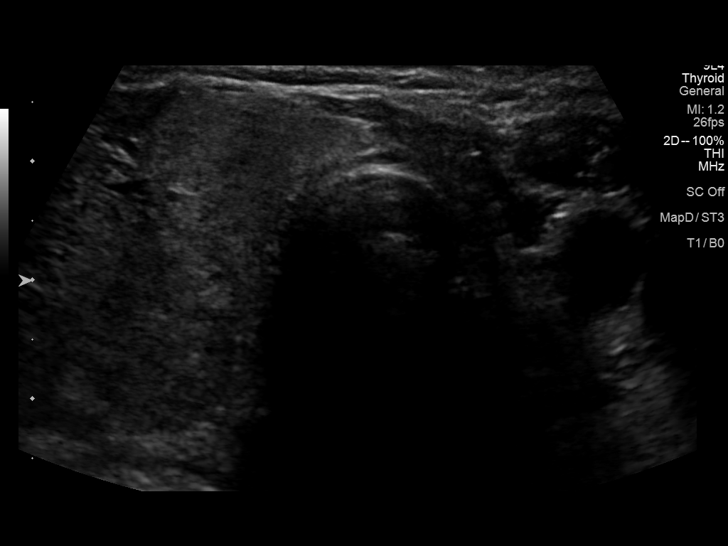
[im 3/34]
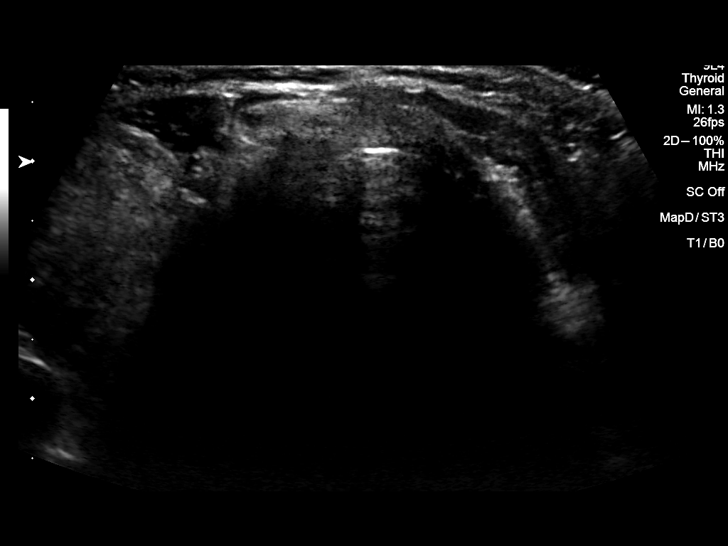
[im 6/34]
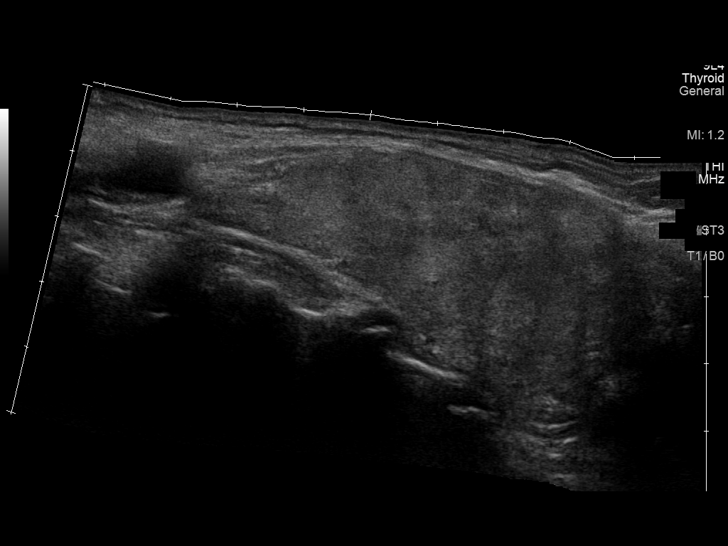
[im 9/34]
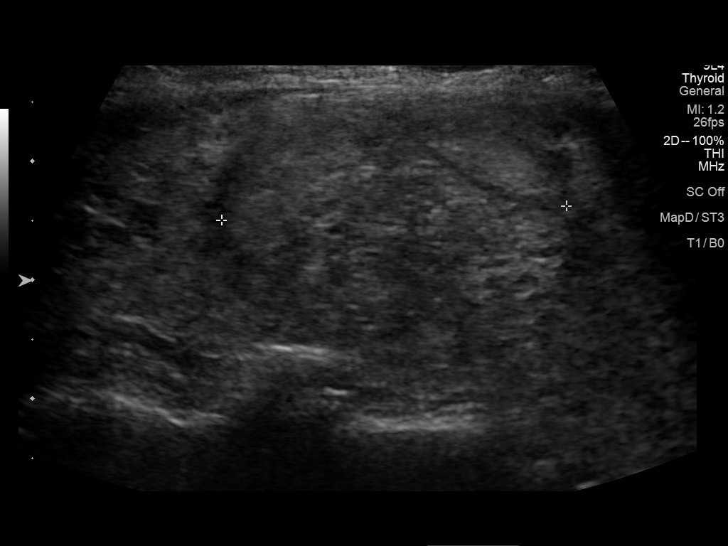
[im 12/34]
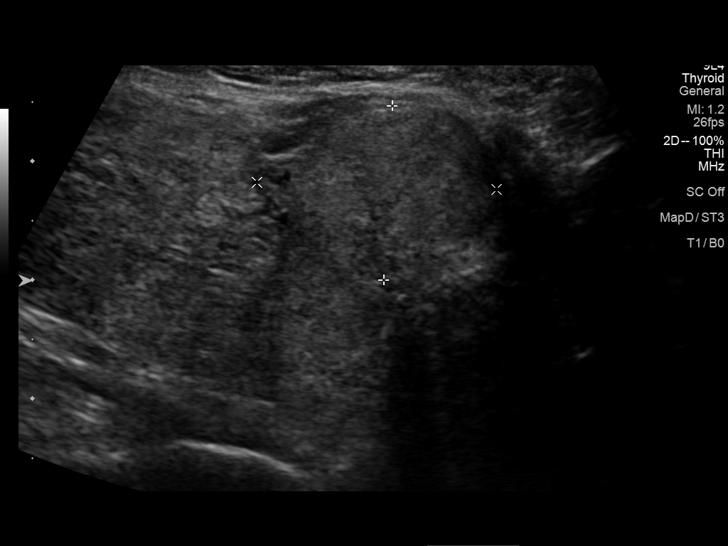
[im 14/34]
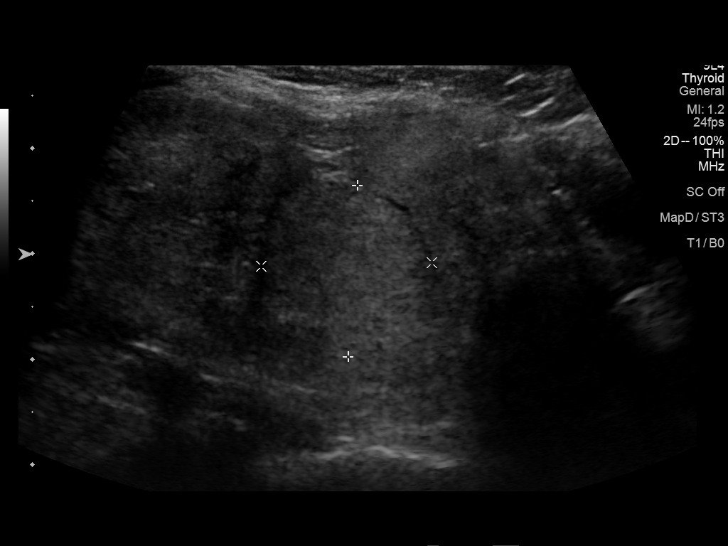
[im 17/34]
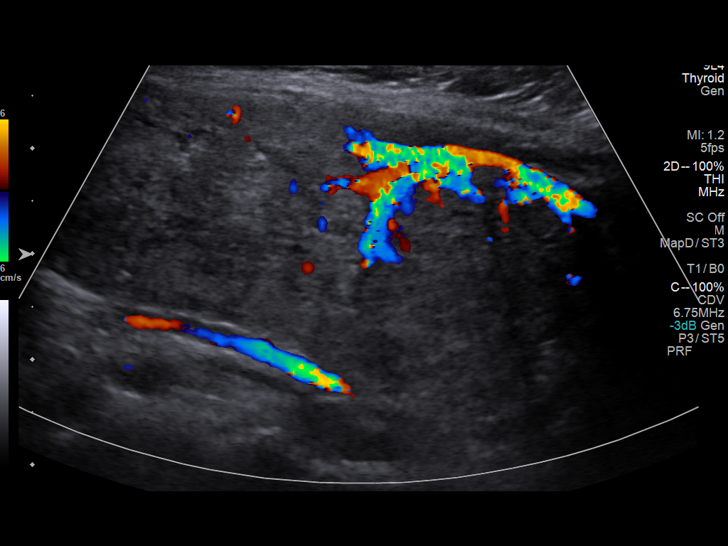
[im 20/34]
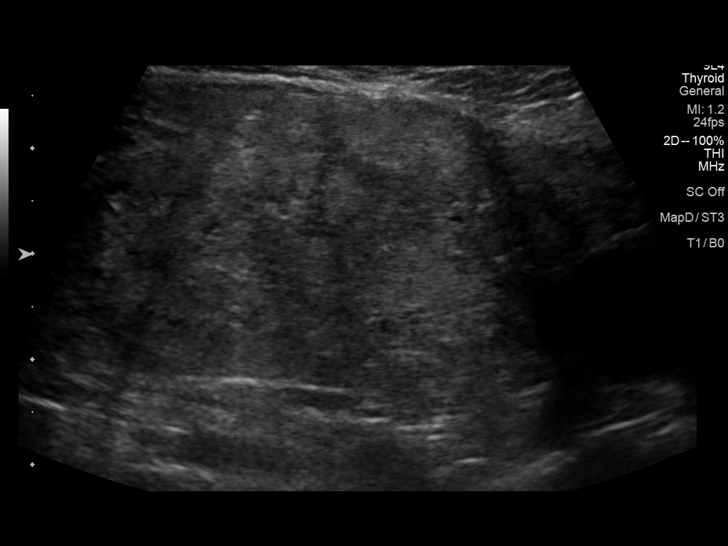
[im 23/34]
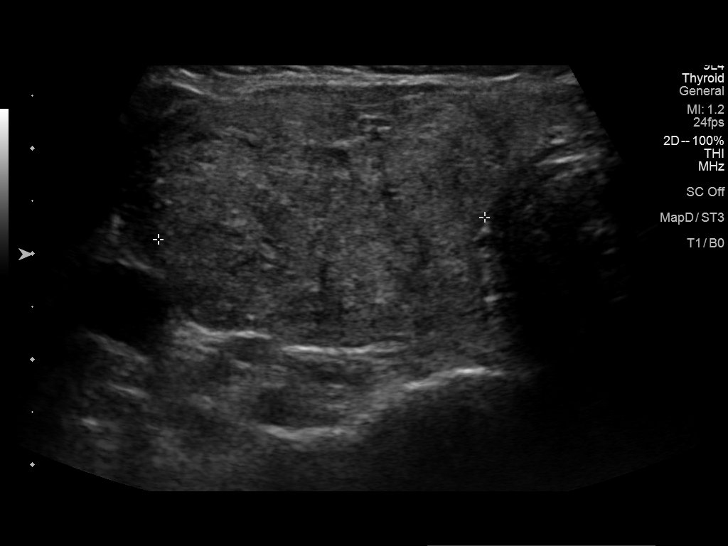
[im 25/34]
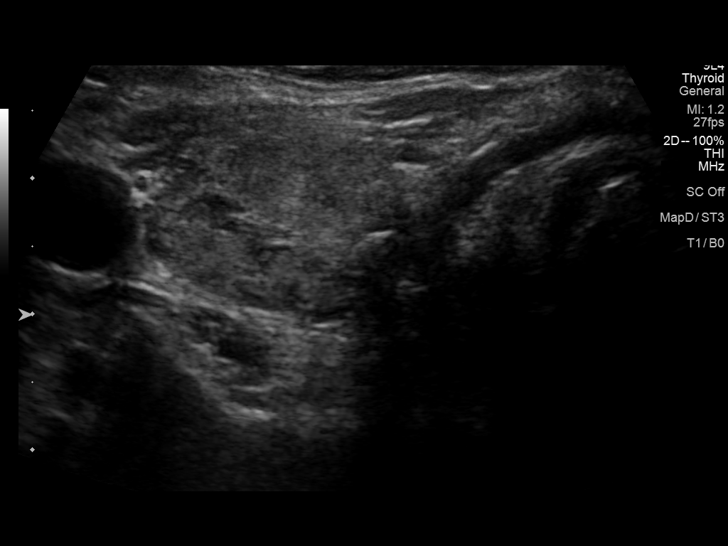
[im 28/34]
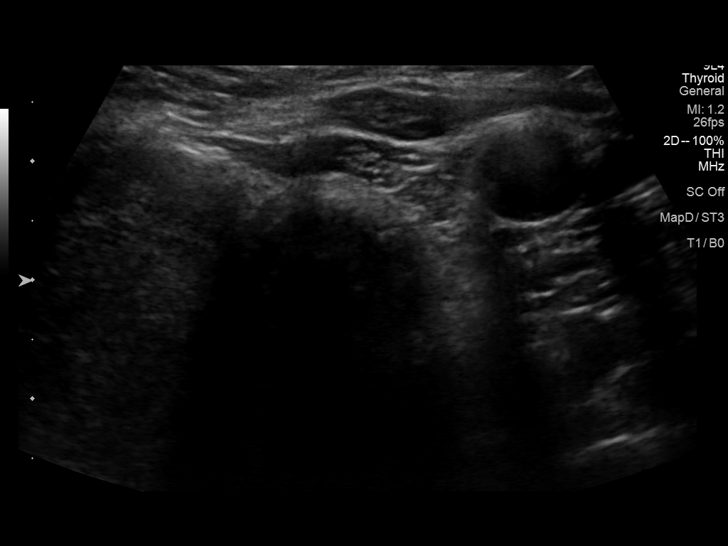
[im 31/34]
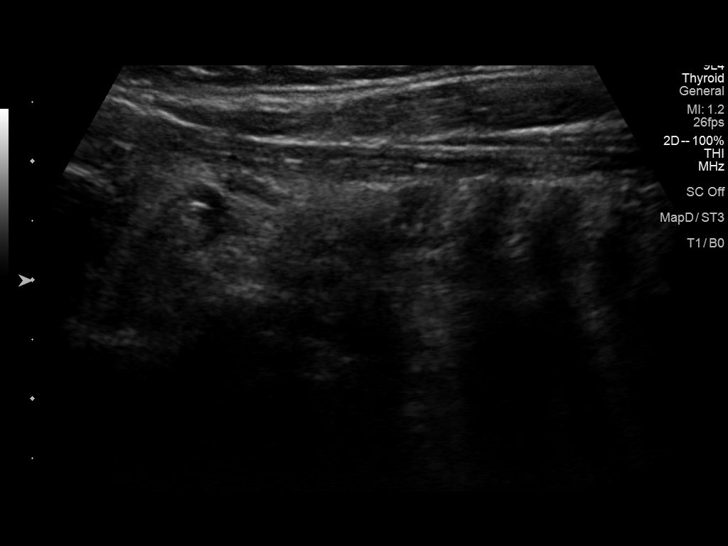
[im 34/34]
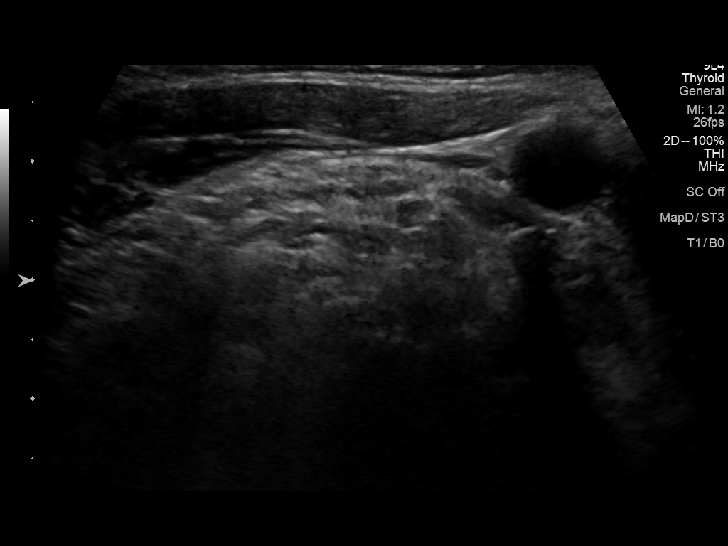

[13 of 25 positions shown; findings below may reference images not displayed]

FINDINGS: Parenchymal Echotexture: Mildly heterogenous

Isthmus: 0.3 cm

Right lobe: 7.6 cm x 3.5 cm x 3.1 cm

Left lobe: Left hemithyroidectomy

_________________________________________________________

Estimated total number of nodules >/= 1 cm: 3

Number of spongiform nodules >/=  2 cm not described below (TR1): 0

Number of mixed cystic and solid nodules >/= 1.5 cm not described
below (TR2): 0

_________________________________________________________

Nodule # 1:

Location: Right; Superior

Maximum size: 2.9 cm; Other 2 dimensions: 2.7 cm x 1.8 cm

Composition: solid/almost completely solid (2)

Echogenicity: isoechoic (1)

Shape: not taller-than-wide (0)

Margins: ill-defined (0)

Echogenic foci: none (0)

ACR TI-RADS total points: 3.

ACR TI-RADS risk category: TR3 (3 points).

ACR TI-RADS recommendations:

Nodule meets criteria for biopsy

_________________________________________________________

Nodule # 2:

Location: Right; Mid

Maximum size: 2.1 cm; Other 2 dimensions: 2.0 cm x 1.5 cm

Composition: solid/almost completely solid (2)

Echogenicity: isoechoic (1)

Shape: not taller-than-wide (0)

Margins: ill-defined (0)

Echogenic foci: none (0)

ACR TI-RADS total points: 3.

ACR TI-RADS risk category: TR3 (3 points).

ACR TI-RADS recommendations:

Nodule meets criteria for surveillance

_________________________________________________________

Nodule # 3:

Location: Right; Inferior

Maximum size: 2.0 cm; Other 2 dimensions: 1.6 cm x 1.6 cm

Composition: solid/almost completely solid (2)

Echogenicity: isoechoic (1)

Shape: not taller-than-wide (0)

Margins: ill-defined (0)

Echogenic foci: none (0)

ACR TI-RADS total points: 3.

ACR TI-RADS risk category: TR3 (3 points).

ACR TI-RADS recommendations:

Nodule meets criteria for surveillance

_________________________________________________________

No adenopathy
IMPRESSION: Redemonstration of left hemithyroidectomy.

Redemonstration of multinodular right thyroid.

Based on the updated TIRADS criteria, all 3 nodules within the right
thyroid are TR 3, and have been relatively stable for greater than 5
years. This includes the nodule labeled 1 which measures 2.9 cm,
though is decreased in size from the comparison of 5 years prior. No
further surveillance would be indicated.

Recommendations follow those established by the new ACR TI-RADS
criteria ([HOSPITAL] 7188;[DATE]).

## 2022-02-25 DIAGNOSIS — L905 Scar conditions and fibrosis of skin: Secondary | ICD-10-CM | POA: Diagnosis not present

## 2022-02-25 DIAGNOSIS — D2261 Melanocytic nevi of right upper limb, including shoulder: Secondary | ICD-10-CM | POA: Diagnosis not present

## 2022-02-25 DIAGNOSIS — L570 Actinic keratosis: Secondary | ICD-10-CM | POA: Diagnosis not present

## 2022-02-25 DIAGNOSIS — L814 Other melanin hyperpigmentation: Secondary | ICD-10-CM | POA: Diagnosis not present

## 2022-02-25 DIAGNOSIS — L821 Other seborrheic keratosis: Secondary | ICD-10-CM | POA: Diagnosis not present

## 2022-02-25 DIAGNOSIS — D225 Melanocytic nevi of trunk: Secondary | ICD-10-CM | POA: Diagnosis not present

## 2022-02-25 DIAGNOSIS — D1801 Hemangioma of skin and subcutaneous tissue: Secondary | ICD-10-CM | POA: Diagnosis not present

## 2022-02-25 DIAGNOSIS — Z85828 Personal history of other malignant neoplasm of skin: Secondary | ICD-10-CM | POA: Diagnosis not present

## 2022-02-25 DIAGNOSIS — L820 Inflamed seborrheic keratosis: Secondary | ICD-10-CM | POA: Diagnosis not present

## 2022-11-14 DIAGNOSIS — R7301 Impaired fasting glucose: Secondary | ICD-10-CM | POA: Diagnosis not present

## 2022-11-14 DIAGNOSIS — M8580 Other specified disorders of bone density and structure, unspecified site: Secondary | ICD-10-CM | POA: Diagnosis not present

## 2022-11-14 DIAGNOSIS — E042 Nontoxic multinodular goiter: Secondary | ICD-10-CM | POA: Diagnosis not present

## 2022-11-14 DIAGNOSIS — E890 Postprocedural hypothyroidism: Secondary | ICD-10-CM | POA: Diagnosis not present

## 2022-11-14 DIAGNOSIS — E785 Hyperlipidemia, unspecified: Secondary | ICD-10-CM | POA: Diagnosis not present

## 2022-11-21 DIAGNOSIS — E890 Postprocedural hypothyroidism: Secondary | ICD-10-CM | POA: Diagnosis not present

## 2022-11-21 DIAGNOSIS — F5101 Primary insomnia: Secondary | ICD-10-CM | POA: Diagnosis not present

## 2022-11-21 DIAGNOSIS — Z1339 Encounter for screening examination for other mental health and behavioral disorders: Secondary | ICD-10-CM | POA: Diagnosis not present

## 2022-11-21 DIAGNOSIS — Z0000 Encounter for general adult medical examination without abnormal findings: Secondary | ICD-10-CM | POA: Diagnosis not present

## 2022-11-21 DIAGNOSIS — R7301 Impaired fasting glucose: Secondary | ICD-10-CM | POA: Diagnosis not present

## 2022-11-21 DIAGNOSIS — M8580 Other specified disorders of bone density and structure, unspecified site: Secondary | ICD-10-CM | POA: Diagnosis not present

## 2022-11-21 DIAGNOSIS — Z23 Encounter for immunization: Secondary | ICD-10-CM | POA: Diagnosis not present

## 2022-11-21 DIAGNOSIS — M5416 Radiculopathy, lumbar region: Secondary | ICD-10-CM | POA: Diagnosis not present

## 2022-11-21 DIAGNOSIS — E785 Hyperlipidemia, unspecified: Secondary | ICD-10-CM | POA: Diagnosis not present

## 2022-11-21 DIAGNOSIS — F419 Anxiety disorder, unspecified: Secondary | ICD-10-CM | POA: Diagnosis not present

## 2022-11-21 DIAGNOSIS — Z1331 Encounter for screening for depression: Secondary | ICD-10-CM | POA: Diagnosis not present

## 2022-11-26 DIAGNOSIS — R82998 Other abnormal findings in urine: Secondary | ICD-10-CM | POA: Diagnosis not present

## 2022-12-04 DIAGNOSIS — Z1231 Encounter for screening mammogram for malignant neoplasm of breast: Secondary | ICD-10-CM | POA: Diagnosis not present

## 2023-01-09 DIAGNOSIS — U071 COVID-19: Secondary | ICD-10-CM | POA: Diagnosis not present

## 2023-01-09 DIAGNOSIS — R509 Fever, unspecified: Secondary | ICD-10-CM | POA: Diagnosis not present

## 2023-01-09 DIAGNOSIS — R0602 Shortness of breath: Secondary | ICD-10-CM | POA: Diagnosis not present

## 2023-01-30 ENCOUNTER — Other Ambulatory Visit: Payer: Self-pay

## 2023-01-30 ENCOUNTER — Emergency Department (HOSPITAL_BASED_OUTPATIENT_CLINIC_OR_DEPARTMENT_OTHER): Payer: Medicare PPO

## 2023-01-30 ENCOUNTER — Emergency Department (HOSPITAL_BASED_OUTPATIENT_CLINIC_OR_DEPARTMENT_OTHER)
Admission: EM | Admit: 2023-01-30 | Discharge: 2023-01-30 | Disposition: A | Discharge status: Home / Self Care | Payer: Medicare PPO | Attending: Emergency Medicine | Admitting: Emergency Medicine

## 2023-01-30 DIAGNOSIS — R1032 Left lower quadrant pain: Secondary | ICD-10-CM | POA: Insufficient documentation

## 2023-01-30 DIAGNOSIS — R112 Nausea with vomiting, unspecified: Secondary | ICD-10-CM | POA: Diagnosis not present

## 2023-01-30 DIAGNOSIS — N200 Calculus of kidney: Secondary | ICD-10-CM

## 2023-01-30 DIAGNOSIS — E039 Hypothyroidism, unspecified: Secondary | ICD-10-CM | POA: Diagnosis not present

## 2023-01-30 DIAGNOSIS — D72829 Elevated white blood cell count, unspecified: Secondary | ICD-10-CM | POA: Diagnosis not present

## 2023-01-30 DIAGNOSIS — N202 Calculus of kidney with calculus of ureter: Secondary | ICD-10-CM | POA: Diagnosis not present

## 2023-01-30 LAB — URINALYSIS, ROUTINE W REFLEX MICROSCOPIC
Bacteria, UA: NONE SEEN
Glucose, UA: NEGATIVE mg/dL
Ketones, ur: 15 mg/dL — AB
Leukocytes,Ua: NEGATIVE
Nitrite: NEGATIVE
Protein, ur: NEGATIVE mg/dL
Specific Gravity, Urine: >1.046 — ABNORMAL HIGH (ref 1.005–1.030)
pH: 5.5 (ref 5.0–8.0)

## 2023-01-30 LAB — COMPREHENSIVE METABOLIC PANEL
ALT: 17 U/L (ref 0–44)
AST: 31 U/L (ref 15–41)
Albumin: 4.3 g/dL (ref 3.5–5.0)
Alkaline Phosphatase: 44 U/L (ref 38–126)
Anion gap: 12 (ref 5–15)
BUN: 14 mg/dL (ref 8–23)
CO2: 23 mmol/L (ref 22–32)
Calcium: 10.4 mg/dL — ABNORMAL HIGH (ref 8.9–10.3)
Chloride: 102 mmol/L (ref 98–111)
Creatinine, Ser: 0.73 mg/dL (ref 0.44–1.00)
GFR, Estimated: >60 mL/min (ref >60)
Glucose, Bld: 98 mg/dL (ref 70–99)
Potassium: 3.8 mmol/L (ref 3.5–5.1)
Sodium: 137 mmol/L (ref 135–145)
Total Bilirubin: 0.8 mg/dL (ref 0.3–1.2)
Total Protein: 7.1 g/dL (ref 6.5–8.1)

## 2023-01-30 LAB — CBC
HCT: 43.6 % (ref 36.0–46.0)
Hemoglobin: 14.8 g/dL (ref 12.0–15.0)
MCH: 32.2 pg (ref 26.0–34.0)
MCHC: 33.9 g/dL (ref 30.0–36.0)
MCV: 94.8 fL (ref 80.0–100.0)
Platelets: 185 10*3/uL (ref 150–400)
RBC: 4.6 MIL/uL (ref 3.87–5.11)
RDW: 13.2 % (ref 11.5–15.5)
WBC: 11.1 10*3/uL — ABNORMAL HIGH (ref 4.0–10.5)
nRBC: 0 % (ref 0.0–0.2)

## 2023-01-30 LAB — LIPASE, BLOOD: Lipase: <10 U/L — ABNORMAL LOW (ref 11–51)

## 2023-01-30 LAB — TROPONIN I (HIGH SENSITIVITY): Troponin I (High Sensitivity): 7 ng/L (ref <18)

## 2023-01-30 MED ORDER — IOHEXOL 300 MG/ML  SOLN
100.0000 mL | Freq: Once | INTRAMUSCULAR | Status: AC | PRN
  Administered 2023-01-30: 85 mL via INTRAVENOUS

## 2023-01-30 MED ORDER — OXYCODONE-ACETAMINOPHEN 5-325 MG PO TABS: 1.0000 | ORAL_TABLET | Freq: Three times a day (TID) | ORAL | 0 refills | Status: AC | PRN

## 2023-01-30 MED ORDER — OXYCODONE-ACETAMINOPHEN 5-325 MG PO TABS
1.0000 | ORAL_TABLET | ORAL | Status: DC | PRN
  Administered 2023-01-30: 1 via ORAL
  Filled 2023-01-30: qty 1

## 2023-01-30 MED ORDER — OXYCODONE-ACETAMINOPHEN 5-325 MG PO TABS: 1.0000 | ORAL_TABLET | Freq: Three times a day (TID) | ORAL | 0 refills | Status: DC | PRN

## 2023-01-30 MED ORDER — ONDANSETRON HCL 4 MG/2ML IJ SOLN
4.0000 mg | Freq: Once | INTRAMUSCULAR | Status: AC | PRN
  Administered 2023-01-30: 4 mg via INTRAVENOUS
  Filled 2023-01-30: qty 2

## 2023-01-30 MED ORDER — KETOROLAC TROMETHAMINE 15 MG/ML IJ SOLN
15.0000 mg | Freq: Once | INTRAMUSCULAR | Status: AC
  Administered 2023-01-30: 15 mg via INTRAVENOUS
  Filled 2023-01-30: qty 1

## 2023-01-30 MED ORDER — ONDANSETRON 4 MG PO TBDP: 4.0000 mg | ORAL_TABLET | Freq: Three times a day (TID) | ORAL | 0 refills | Status: AC | PRN

## 2023-01-30 NOTE — ED Notes (Signed)
Patient to CT by wheelchair.

## 2023-01-30 NOTE — ED Notes (Signed)
Reviewed AVS with patient, patient expressed understanding of directions, denies further questions at this time. 

## 2023-01-30 NOTE — ED Provider Notes (Signed)
[place] EMERGENCY DEPARTMENT AT [facility] Provider Note   CSN: [id number] Arrival date & time: 01/30/23  2019     History  Chief Complaint  Patient presents with   Abdominal Pain    Alexis Hull is a 77 y.o. female with past medical history of kidney stones, diverticulosis and diverticulitis, hypothyroidism, hyperlipidemia reporting to emergency room today with left lower quadrant pain that started around 5 PM today.  Patient had associated nausea.  Denies change in bowel movements or blood in urine or stool.  Denies dysuria.  Patient has past medical history of abdominal surgery with sigmoid colectomy secondary to severe diverticulitis.  Patient reports this pain feels similar to her diverticulitis in the past, however has not had any recent episodes of pain.  Patient was given Zofran and oxycodone at 845 and now feels that her pain is more tolerable. Denies chest pain or shortness of breath.     Abdominal Pain      Home Medications Prior to Admission medications   Medication Sig Start Date End Date Taking? Authorizing Provider  ALPRAZolam Prudy Feeler) 0.25 MG tablet Take 0.25 mg by mouth at bedtime as needed for anxiety.    [provider]  atorvastatin (LIPITOR) 10 MG tablet Take 10 mg by mouth at bedtime. 03/02/15   [provider]  Biotin 5000 MCG TABS Take 1 tablet by mouth daily.    [provider]  Calcium Carb-Cholecalciferol (CALCIUM 600 + D PO) Take by mouth daily. 2 tablets daily    [provider]  conjugated estrogens (PREMARIN) vaginal cream Use 1 g vaginally two times a week 07/15/17   Verner Chol, CNM  gabapentin (NEURONTIN) 300 MG capsule TAKE 1 CAPSULE BY MOUTH THREE TIMES A DAY Patient not taking: Reported on 11/21/2021 01/13/20   Kathryne Hitch, MD  GLUCOSAMINE-CHONDROITIN PO Take by mouth.    [provider]  levothyroxine (SYNTHROID, LEVOTHROID) 50 MCG tablet Take 50 mcg by mouth every  evening.     [provider]  loratadine (CLARITIN) 10 MG tablet Take 10 mg by mouth daily.    [provider]  Lysine 1000 MG TABS Take by mouth.    [provider]  magnesium citrate SOLN Take 1 Bottle by mouth once.    [provider]  Methylcellulose, Laxative, (CITRUCEL) 500 MG TABS Take by mouth daily. Citrucel    [provider]  Multiple Vitamins-Minerals (HAIR/SKIN/NAILS PO) Take 1 tablet by mouth daily.    [provider]  ondansetron (ZOFRAN) 4 MG tablet Take 1 tablet (4 mg total) by mouth as directed. Take 1 tab 30-60 minutes prior to each prep dose 11/21/21   Napoleon Form, MD      Allergies    Celebrex [celecoxib], Morphine and codeine, Tape, and Penicillins    Review of Systems   Review of Systems  Gastrointestinal:  Positive for abdominal pain.    Physical Exam Updated Vital Signs BP (!) 143/74   Pulse 69   Temp 98.5 F (36.9 C)   Resp 16   Ht 5\' 4"  (1.626 m)   Wt 61.2 kg   LMP 08/31/1994 (Approximate)   SpO2 94%   BMI 23.17 kg/m  Physical Exam Vitals and nursing note reviewed.  Constitutional:      General: She is not in acute distress.    Appearance: She is not toxic-appearing.  HENT:     Head: Normocephalic and atraumatic.  Eyes:     General: No  scleral icterus.    Conjunctiva/sclera: Conjunctivae normal.  Cardiovascular:     Rate and Rhythm: Normal rate and regular rhythm.     Pulses: Normal pulses.     Heart sounds: Normal heart sounds.  Pulmonary:     Effort: Pulmonary effort is normal. No respiratory distress.     Breath sounds: Normal breath sounds.  Chest:     Chest wall: No tenderness.  Abdominal:     General: Abdomen is flat. Bowel sounds are normal. There is no distension.     Palpations: Abdomen is soft. There is no mass.     Tenderness: There is abdominal tenderness. There is no right CVA tenderness, left CVA tenderness or guarding.     Hernia: No hernia is present.      Comments: LLQ tenderness to palpation   Skin:    General: Skin is warm and dry.     Findings: No lesion.  Neurological:     General: No focal deficit present.     Mental Status: She is alert and oriented to person, place, and time. Mental status is at baseline.     ED Results / Procedures / Treatments   Labs (all labs ordered are listed, but only abnormal results are displayed) Labs Reviewed  LIPASE, BLOOD - Abnormal; Notable for the following components:      Result Value   Lipase <10 (*)    All other components within normal limits  COMPREHENSIVE METABOLIC PANEL - Abnormal; Notable for the following components:   Calcium 10.4 (*)    All other components within normal limits  CBC - Abnormal; Notable for the following components:   WBC 11.1 (*)    All other components within normal limits  URINALYSIS, ROUTINE W REFLEX MICROSCOPIC - Abnormal; Notable for the following components:   Specific Gravity, Urine >1.046 (*)    Hgb urine dipstick TRACE (*)    Bilirubin Urine SMALL (*)    Ketones, ur 15 (*)    All other components within normal limits  TROPONIN I (HIGH SENSITIVITY)    EKG EKG Interpretation Date/Time:  Thursday January 30 2023 20:35:34 EDT Ventricular Rate:  71 PR Interval:  188 QRS Duration:  78 QT Interval:  372 QTC Calculation: 404 R Axis:   74  Text Interpretation: Normal sinus rhythm Cannot rule out Anteroseptal infarct , age undetermined Abnormal ECG No significant change since last tracing Confirmed by Elayne Snare ([id number]) on 01/30/2023 9:17:19 PM  Radiology No results found.  Procedures Procedures    Medications Ordered in ED Medications  oxyCODONE-acetaminophen (PERCOCET/ROXICET) 5-325 MG per tablet 1 tablet (1 tablet Oral Given 01/30/23 2046)  ondansetron (ZOFRAN) injection 4 mg (4 mg Intravenous Given 01/30/23 2042)    ED Course/ Medical Decision Making/ A&P                                 Medical Decision Making Amount and/or  Complexity of Data Reviewed Labs: ordered. Radiology: ordered.  Risk Prescription drug management.   Alexis Hull 77 y.o. presented today for abd pain. Working DDx includes, but not limited to, gastroenteritis, colitis, SBO, appendicitis, cholecystitis, hepatobiliary pathology, gastritis, PUD, ACS, dissection, pancreatitis, nephrolithiasis, AAA, UTI, pyelonephritis, ruptured ectopic pregnancy, PID  R/o DDx: These are considered less likely than current impression due to history of present illness, physical exam, labs/imaging findings.  Review of prior external notes: 12/27/2021 for visit with [facility] gastro[facility]ology  Pmhx: Colon polyps,  abdominal surgery, kidney stones, hypothyroidism, hyperlipidemia, colon polyps  Unique Tests and My Interpretation:  CBC with differential: Leukocytosis with white blood cell count of 11.1 no sign of anemia CMP: AST and ALT within normal limits, no electrolyte abnormality Lipase: wnl UA: Trace hemoglobin, negative nitrates negative leukocytes Troponin 7  EKG: Rate, rhythm, axis, intervals all examined: Normal sinus  Imaging: CT Abd/Pelvis with contrast: evaluate for structural/surgical etiology of patients' severe abdominal pain.    Problem List / ED Course / Critical interventions / Medication management  Patient reporting with left lower quadrant abdominal pain that has been associated with decreased appetite and nausea.  Given elevated white blood cell count and concern for possible diverticulitis I will order CT scan to definitively diagnose abdominal pain.  Patient's pain is under control at this time.  No episodes of vomiting since arriving to emergency room and after receiving Zofran. Patient CT scan shows evidence of kidney stone.  Patient hemodynamically stable with no signs of systemic illness.  Patient has no urinary tract infection and pain and nausea under control at this time.  I will send patient with outpatient urology follow-up.   Encourage patient to follow-up with primary care if urology cannot get her in quickly.  Patient agrees and understands plan. I ordered medication including Oxycodone  for pain  Reevaluation of the patient after these medicines showed that the patient improved Patients vitals assessed. Upon arrival patient is hemodynamically stable.  I have reviewed the patients home medicines and have made adjustments as needed   Consult: None   Plan: Patient stable from discharged.  Follow-up with urology to ensure resolution of symptoms.  Patient hemodynamically stable.  All questions answered at this time.  Sent medication for pain control and Zofran.  Return to emergency room with any new or worsening symptoms.  Patient given strict return precautions.          Final Clinical Impression(s) / ED Diagnoses Final diagnoses:  None    Rx / DC Orders ED Discharge Orders     None         Jearld Hemp, Horald Chestnut, PA-C 01/30/23 2344    Elayne Snare K, DO 01/31/23 1620

## 2023-01-30 NOTE — Discharge Instructions (Addendum)
You were seen in the emergency room today for abdominal pain.  Your CT scan shows kidney stone on the left side.  Please alternate Tylenol and ibuprofen for pain control.  You can also take oxycodone as needed for breakthrough pain.  Zofran as needed for nausea.  Make sure you are staying well-hydrated with water alternate Pedialyte and Gatorade as needed.  I would like you to follow-up with urology, please call [facility] for follow-up appointment.  Please call your primary care doctor to ensure resolution of symptoms.  Please return to the emergency room if you have intractable pain or vomiting, signs of infection like fever or feeling very ill.

## 2023-01-30 NOTE — ED Triage Notes (Addendum)
Left flank pain into front of abd- comes in waves- accompanied by nausea and emesis. Started 1700. No urinary changes. Denies recent illness or fevers. HX kidney stones. NAD in triage.

## 2023-02-07 DIAGNOSIS — M25531 Pain in right wrist: Secondary | ICD-10-CM | POA: Diagnosis not present

## 2023-02-07 DIAGNOSIS — M5416 Radiculopathy, lumbar region: Secondary | ICD-10-CM | POA: Diagnosis not present

## 2023-02-07 DIAGNOSIS — M48061 Spinal stenosis, lumbar region without neurogenic claudication: Secondary | ICD-10-CM | POA: Diagnosis not present

## 2023-02-07 DIAGNOSIS — N200 Calculus of kidney: Secondary | ICD-10-CM | POA: Diagnosis not present

## 2023-02-13 ENCOUNTER — Other Ambulatory Visit: Payer: Self-pay | Admitting: Medical Genetics

## 2023-02-13 DIAGNOSIS — Z006 Encounter for examination for normal comparison and control in clinical research program: Secondary | ICD-10-CM

## 2023-02-20 DIAGNOSIS — M9903 Segmental and somatic dysfunction of lumbar region: Secondary | ICD-10-CM | POA: Diagnosis not present

## 2023-02-20 DIAGNOSIS — M5432 Sciatica, left side: Secondary | ICD-10-CM | POA: Diagnosis not present

## 2023-02-24 DIAGNOSIS — M9903 Segmental and somatic dysfunction of lumbar region: Secondary | ICD-10-CM | POA: Diagnosis not present

## 2023-02-24 DIAGNOSIS — M5432 Sciatica, left side: Secondary | ICD-10-CM | POA: Diagnosis not present

## 2023-03-04 DIAGNOSIS — M545 Low back pain, unspecified: Secondary | ICD-10-CM | POA: Diagnosis not present

## 2023-03-04 DIAGNOSIS — M5416 Radiculopathy, lumbar region: Secondary | ICD-10-CM | POA: Diagnosis not present

## 2023-03-13 DIAGNOSIS — M5416 Radiculopathy, lumbar region: Secondary | ICD-10-CM | POA: Diagnosis not present

## 2023-03-24 ENCOUNTER — Other Ambulatory Visit: Payer: Self-pay | Admitting: Student

## 2023-03-24 DIAGNOSIS — M5416 Radiculopathy, lumbar region: Secondary | ICD-10-CM

## 2023-04-04 DIAGNOSIS — M545 Low back pain, unspecified: Secondary | ICD-10-CM | POA: Diagnosis not present

## 2023-04-04 DIAGNOSIS — M5416 Radiculopathy, lumbar region: Secondary | ICD-10-CM | POA: Diagnosis not present

## 2023-04-08 ENCOUNTER — Ambulatory Visit
Admission: RE | Admit: 2023-04-08 | Discharge: 2023-04-08 | Disposition: A | Discharge status: Home / Self Care | Payer: Medicare PPO | Source: Ambulatory Visit | Attending: Student | Admitting: Student

## 2023-04-08 DIAGNOSIS — M5416 Radiculopathy, lumbar region: Secondary | ICD-10-CM

## 2023-04-08 DIAGNOSIS — M47816 Spondylosis without myelopathy or radiculopathy, lumbar region: Secondary | ICD-10-CM | POA: Diagnosis not present

## 2023-04-08 DIAGNOSIS — M4316 Spondylolisthesis, lumbar region: Secondary | ICD-10-CM | POA: Diagnosis not present

## 2023-04-11 DIAGNOSIS — M5416 Radiculopathy, lumbar region: Secondary | ICD-10-CM | POA: Diagnosis not present

## 2023-04-11 DIAGNOSIS — M545 Low back pain, unspecified: Secondary | ICD-10-CM | POA: Diagnosis not present

## 2023-04-22 DIAGNOSIS — M5416 Radiculopathy, lumbar region: Secondary | ICD-10-CM | POA: Diagnosis not present

## 2023-04-22 DIAGNOSIS — M545 Low back pain, unspecified: Secondary | ICD-10-CM | POA: Diagnosis not present

## 2023-05-01 DIAGNOSIS — M7918 Myalgia, other site: Secondary | ICD-10-CM | POA: Diagnosis not present

## 2023-05-02 DIAGNOSIS — M545 Low back pain, unspecified: Secondary | ICD-10-CM | POA: Diagnosis not present

## 2023-05-02 DIAGNOSIS — M5416 Radiculopathy, lumbar region: Secondary | ICD-10-CM | POA: Diagnosis not present

## 2023-05-07 DIAGNOSIS — M545 Low back pain, unspecified: Secondary | ICD-10-CM | POA: Diagnosis not present

## 2023-05-07 DIAGNOSIS — M5416 Radiculopathy, lumbar region: Secondary | ICD-10-CM | POA: Diagnosis not present

## 2023-05-14 DIAGNOSIS — M545 Low back pain, unspecified: Secondary | ICD-10-CM | POA: Diagnosis not present

## 2023-05-14 DIAGNOSIS — M5416 Radiculopathy, lumbar region: Secondary | ICD-10-CM | POA: Diagnosis not present

## 2023-05-26 DIAGNOSIS — M5416 Radiculopathy, lumbar region: Secondary | ICD-10-CM | POA: Diagnosis not present

## 2023-05-26 DIAGNOSIS — M545 Low back pain, unspecified: Secondary | ICD-10-CM | POA: Diagnosis not present

## 2023-06-17 DIAGNOSIS — D225 Melanocytic nevi of trunk: Secondary | ICD-10-CM | POA: Diagnosis not present

## 2023-06-17 DIAGNOSIS — L821 Other seborrheic keratosis: Secondary | ICD-10-CM | POA: Diagnosis not present

## 2023-06-17 DIAGNOSIS — D2272 Melanocytic nevi of left lower limb, including hip: Secondary | ICD-10-CM | POA: Diagnosis not present

## 2023-06-17 DIAGNOSIS — L814 Other melanin hyperpigmentation: Secondary | ICD-10-CM | POA: Diagnosis not present

## 2023-06-17 DIAGNOSIS — L905 Scar conditions and fibrosis of skin: Secondary | ICD-10-CM | POA: Diagnosis not present

## 2023-08-29 ENCOUNTER — Other Ambulatory Visit (HOSPITAL_COMMUNITY)
Admission: RE | Admit: 2023-08-29 | Discharge: 2023-08-29 | Disposition: A | Discharge status: Home / Self Care | Payer: Self-pay | Source: Ambulatory Visit | Attending: Medical Genetics | Admitting: Medical Genetics

## 2023-08-29 DIAGNOSIS — Z006 Encounter for examination for normal comparison and control in clinical research program: Secondary | ICD-10-CM | POA: Insufficient documentation

## 2023-09-09 LAB — GENECONNECT MOLECULAR SCREEN: Genetic Analysis Overall Interpretation: NEGATIVE

## 2023-12-12 DIAGNOSIS — Z23 Encounter for immunization: Secondary | ICD-10-CM | POA: Diagnosis not present

## 2023-12-15 DIAGNOSIS — E042 Nontoxic multinodular goiter: Secondary | ICD-10-CM | POA: Diagnosis not present

## 2023-12-15 DIAGNOSIS — R7301 Impaired fasting glucose: Secondary | ICD-10-CM | POA: Diagnosis not present

## 2023-12-15 DIAGNOSIS — E890 Postprocedural hypothyroidism: Secondary | ICD-10-CM | POA: Diagnosis not present

## 2023-12-15 DIAGNOSIS — E785 Hyperlipidemia, unspecified: Secondary | ICD-10-CM | POA: Diagnosis not present

## 2023-12-15 DIAGNOSIS — Z1231 Encounter for screening mammogram for malignant neoplasm of breast: Secondary | ICD-10-CM | POA: Diagnosis not present

## 2023-12-15 DIAGNOSIS — E559 Vitamin D deficiency, unspecified: Secondary | ICD-10-CM | POA: Diagnosis not present

## 2023-12-23 DIAGNOSIS — E890 Postprocedural hypothyroidism: Secondary | ICD-10-CM | POA: Diagnosis not present

## 2023-12-23 DIAGNOSIS — E785 Hyperlipidemia, unspecified: Secondary | ICD-10-CM | POA: Diagnosis not present

## 2023-12-23 DIAGNOSIS — I8393 Asymptomatic varicose veins of bilateral lower extremities: Secondary | ICD-10-CM | POA: Diagnosis not present

## 2023-12-23 DIAGNOSIS — Z1339 Encounter for screening examination for other mental health and behavioral disorders: Secondary | ICD-10-CM | POA: Diagnosis not present

## 2023-12-23 DIAGNOSIS — M5416 Radiculopathy, lumbar region: Secondary | ICD-10-CM | POA: Diagnosis not present

## 2023-12-23 DIAGNOSIS — M8580 Other specified disorders of bone density and structure, unspecified site: Secondary | ICD-10-CM | POA: Diagnosis not present

## 2023-12-23 DIAGNOSIS — R7301 Impaired fasting glucose: Secondary | ICD-10-CM | POA: Diagnosis not present

## 2023-12-23 DIAGNOSIS — Z860101 Personal history of adenomatous and serrated colon polyps: Secondary | ICD-10-CM | POA: Diagnosis not present

## 2023-12-23 DIAGNOSIS — R82998 Other abnormal findings in urine: Secondary | ICD-10-CM | POA: Diagnosis not present

## 2023-12-23 DIAGNOSIS — Z1331 Encounter for screening for depression: Secondary | ICD-10-CM | POA: Diagnosis not present

## 2023-12-23 DIAGNOSIS — Z800 Family history of malignant neoplasm of digestive organs: Secondary | ICD-10-CM | POA: Diagnosis not present

## 2023-12-23 DIAGNOSIS — M222X2 Patellofemoral disorders, left knee: Secondary | ICD-10-CM | POA: Diagnosis not present

## 2023-12-23 DIAGNOSIS — F419 Anxiety disorder, unspecified: Secondary | ICD-10-CM | POA: Diagnosis not present

## 2023-12-23 DIAGNOSIS — Z0000 Encounter for general adult medical examination without abnormal findings: Secondary | ICD-10-CM | POA: Diagnosis not present
